# Patient Record
Sex: Female | Born: 2000 | Race: Black or African American | Hispanic: No | Marital: Single | Smoking: Never smoker
Health system: Southern US, Community
[De-identification: ages and names within clinical notes are randomized; demographics above are authoritative.]

## PROBLEM LIST (undated history)

## (undated) DIAGNOSIS — L679 Hair color and hair shaft abnormality, unspecified: Secondary | ICD-10-CM

## (undated) DIAGNOSIS — R7989 Other specified abnormal findings of blood chemistry: Secondary | ICD-10-CM

## (undated) DIAGNOSIS — N915 Oligomenorrhea, unspecified: Secondary | ICD-10-CM

## (undated) DIAGNOSIS — F419 Anxiety disorder, unspecified: Secondary | ICD-10-CM

## (undated) DIAGNOSIS — E282 Polycystic ovarian syndrome: Secondary | ICD-10-CM

## (undated) DIAGNOSIS — L309 Dermatitis, unspecified: Secondary | ICD-10-CM

## (undated) HISTORY — DX: Anxiety disorder, unspecified: F41.9

## (undated) HISTORY — DX: Hair color and hair shaft abnormality, unspecified: L67.9

## (undated) HISTORY — PX: WISDOM TOOTH EXTRACTION: SHX21

## (undated) HISTORY — DX: Oligomenorrhea, unspecified: N91.5

## (undated) HISTORY — DX: Other specified abnormal findings of blood chemistry: R79.89

---
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
## 2019-01-20 ENCOUNTER — Encounter (INDEPENDENT_AMBULATORY_CARE_PROVIDER_SITE_OTHER): Payer: Self-pay | Admitting: Pediatrics

## 2019-01-20 ENCOUNTER — Ambulatory Visit (INDEPENDENT_AMBULATORY_CARE_PROVIDER_SITE_OTHER): Payer: Medicaid Other | Admitting: Pediatrics

## 2019-01-20 VITALS — BP 118/72 | HR 88 | Ht 66.58 in | Wt 258.2 lb

## 2019-01-20 DIAGNOSIS — E669 Obesity, unspecified: Secondary | ICD-10-CM

## 2019-01-20 DIAGNOSIS — R635 Abnormal weight gain: Secondary | ICD-10-CM

## 2019-01-20 DIAGNOSIS — E288 Other ovarian dysfunction: Secondary | ICD-10-CM | POA: Diagnosis not present

## 2019-01-20 DIAGNOSIS — N915 Oligomenorrhea, unspecified: Secondary | ICD-10-CM | POA: Diagnosis not present

## 2019-01-20 DIAGNOSIS — L709 Acne, unspecified: Secondary | ICD-10-CM | POA: Diagnosis not present

## 2019-01-20 DIAGNOSIS — Z68.41 Body mass index (BMI) pediatric, greater than or equal to 95th percentile for age: Secondary | ICD-10-CM

## 2019-01-20 DIAGNOSIS — R7309 Other abnormal glucose: Secondary | ICD-10-CM

## 2019-01-20 DIAGNOSIS — E8881 Metabolic syndrome: Secondary | ICD-10-CM

## 2019-01-20 DIAGNOSIS — E88819 Insulin resistance, unspecified: Secondary | ICD-10-CM

## 2019-01-20 LAB — POCT GLYCOSYLATED HEMOGLOBIN (HGB A1C): HEMOGLOBIN A1C: 5.7 % — AB (ref 4.0–5.6)

## 2019-01-20 LAB — POCT GLUCOSE (DEVICE FOR HOME USE): POC Glucose: 101 mg/dl — AB (ref 70–99)

## 2019-01-20 NOTE — Patient Instructions (Signed)

## 2019-01-20 NOTE — Progress Notes (Signed)
Pediatric Endocrinology Consultation Follow-up Visit  Narda Ambermyah Spera Jun 15, 2001 [id number]   Chief Complaint: elevated testosterone, abnormal weight gain, obesity, oligomenorrhea  HPI: Nancy Delgado  is a 18 y.o. female presenting for follow-up of the above concerns.  she attended this visit alone.   1. Philippa had been followed in the past by Dr. Fransico MichaelBrennan for management of obesity, elevated testosterone and androgen levels, and oligomenorrhea with last visit in 08/2015.  She then returned to our clinic and started following with me in 08/2017 for obesity, elevated androgen levels, and oligomenorrhea so she was started on OCPs in 08/2017.   2. Darcus was last seen at [facility] on 09/29/18.  Since last visit, she has been well overall.  She did have an ED visit for chest pain on 12/18/2018 though EKG/CXR were normal; she was treated with motrin and discharged home.   Life has been stressful recently.  Her 19 year old sister has tried to commit suicide 3 times.  She has also had difficulty with friends recently, so is trying to make new friends in college.  Hyperandrogenism/oligomenorrhea: Continues on Junel Fe 1.5/30.     Withdrawal bleeding during week of inactive pills: Yes.  Menses lasting 5 days Spotting: No Severe cramping: No Acne: Trying to get acne cleared; only on her face currently.  She uses black charcoal soap and an over-the-counter scrub for acne Hair growth: Waxed chin 2 weeks ago, was shaving prior to this.  Very minimal hairs noted to chin.  She has noticed a few longer hairs on her upper back. Smoking: No   Diet review: Was trying limit po intake by eating only once daily, though found she was eating more.  She likes chocolate and thinks she eats this too much.   Activity: Not exercising, can go to the gym  Weight has increased 7lb since last visit.  BMI now 98.84%.   A1c is 5.7% today (was 5.9% at last visit).   Insulin resistance:  Mother, maternal aunt and  MGM have T2DM (mom treated with metformin).   ROS:  All systems reviewed with pertinent positives listed below; otherwise negative. Constitutional: Weight as above.  Sleeping well HEENT: Wears glasses Respiratory: No increased work of breathing currently GI: No constipation or diarrhea GU: periods as above Musculoskeletal: No joint deformity Neuro: Normal affect Endocrine: As above  Past Medical History:   Past Medical History:  Diagnosis Date  . Elevated testosterone level in female   . Hair abnormality    Hirsuitism  . Oligomenorrhea   . PCOS (polycystic ovarian syndrome)     Meds: Outpatient Encounter Medications as of 01/20/2019  Medication Sig  . BLISOVI FE 1.5/30 1.5-30 MG-MCG tablet TAKE 1 TABLET BY MOUTH DAILY  . ibuprofen (ADVIL,MOTRIN) 600 MG tablet Take 600 mg by mouth every 6 (six) hours as needed for headache.   No facility-administered encounter medications on file as of 01/20/2019.     Allergies: No Known Allergies  Surgical History: Past Surgical History:  Procedure Laterality Date  . WISDOM TOOTH EXTRACTION        Family History:  Family History  Problem Relation Age of Onset  . Diabetes Mother        started metformin 08/2017  . Hypertension Maternal Grandmother    Mother has T2DM treated with metformin, also being evaluated for PCOS Maternal aunt has T2DM and PCOS with poor health MGM has T2DM  Social History: [employer], [place] campus She does not smoke  Physical Exam:  Vitals:  01/20/19 1502  BP: 118/72  Pulse: 88  Weight: 258 lb 3.2 oz (117.1 kg)  Height: 5' 6.58" (1.691 m)   BP 118/72   Pulse 88   Ht 5' 6.58" (1.691 m)   Wt 258 lb 3.2 oz (117.1 kg)   LMP 12/28/2018 (Within Days)   BMI 40.96 kg/m  Body mass index: body mass index is 40.96 kg/m. Blood pressure percentiles are not available for patients who are 18 years or older.  Wt Readings from Last 3 Encounters:  01/20/19 258 lb 3.2 oz (117.1 kg) (>99 %, Z= 2.51)*   12/18/18 250 lb (113.4 kg) (>99 %, Z= 2.45)*  09/29/18 251 lb 12.8 oz (114.2 kg) (>99 %, Z= 2.46)*   * Growth percentiles are based on CDC (Girls, 2-20 Years) data.   Ht Readings from Last 3 Encounters:  01/20/19 5' 6.58" (1.691 m) (82 %, Z= 0.92)*  12/18/18 5' 6.5" (1.689 m) (81 %, Z= 0.89)*  09/29/18 5' 6.69" (1.694 m) (83 %, Z= 0.97)*   * Growth percentiles are based on CDC (Girls, 2-20 Years) data.   General: Well developed, overweight female in no acute distress.  Appears stated age Head: Normocephalic, atraumatic.   Eyes:  Pupils equal and round. EOMI.   Sclera white.  No eye drainage.   Ears/Nose/Mouth/Throat: Nares patent, no nasal drainage.  Normal dentition, mucous membranes moist.  Mild facial acne.  Short minimally coarse stubble on chin, few darker curly hairs on upper back Neck: supple, no cervical lymphadenopathy, no thyromegaly, + acanthosis nigricans on posterior neck Cardiovascular: regular rate, normal S1/S2, no murmurs Respiratory: No increased work of breathing.  Lungs clear to auscultation bilaterally.  No wheezes. Abdomen: soft, nontender, nondistended.  No significant hairs on abdomen.  Few striae laterally Extremities: warm, well perfused, cap refill < 2 sec.   Musculoskeletal: Normal muscle mass.  Normal strength Skin: warm, dry.  No rash.  Acne as above.   Neurologic: alert and oriented, normal speech, no tremor.  Tearful during interview when discussing her sister   Labs: Results for orders placed or performed in visit on 01/20/19  POCT Glucose (Device for Home Use)  Result Value Ref Range   Glucose Fasting, POC     POC Glucose 101 (A) 70 - 99 mg/dl  POCT glycosylated hemoglobin (Hb A1C)  Result Value Ref Range   Hemoglobin A1C 5.7 (A) 4.0 - 5.6 %   HbA1c POC (<> result, manual entry)     HbA1c, POC (prediabetic range)     HbA1c, POC (controlled diabetic range)     Labs drawn by Melanie CrazierMinda Kramer on 07/02/2017: Testosterone total 40 (<33) Free  testosterone 11.3 (<3.7) Bioavailable testosterone 24.8 (<7.9) SHBG 7 (15-130)  Labs obtained by Melanie CrazierMinda Kramer on 06/27/17: Total cholesterol 139 Triglycerides 58 HDL 49 LDL 77 Normal CMP (AST 15, ALT 25, BUN 12, Cr 0.71) TSH 1.43 (0.5-4.3) FT4 1.1 (0.8-1.4) 25-OH vitamin D 14 A1c 5.4%  Labs 05/29/15: HbA1c 5.4%, C-peptide 4.04 (normal 0.80-3.90); CBC with Hgb 12.8 and Hct 40.5, but MCV 74.2 and MCH 23.4; TSH 2.314, free T4 0.93, free T3 2.2; LH 11.9, FSH 6.2, testosterone 45, androstenedione 181 (normal 22 -225), DHEAS 307 (normal 37-307), estradiol 36.6, 17-OHP 45 (normal 16-283);   Labs 01/20/15: Dipstick urinalysis: normal  Labs 05/12/14: Testosterone 44 (normal < 30), SHBG 15 (normal 18-114), free testosterone 11.6 (normal 1.0-5.0); CBC with RBC 5.07, Hgb 11.9, Hct 37,1%, low MCV of 73.2, low MCH of 23.5; CMP normal; cholesterol 114, triglycerides 41,  HDL 42, LDL 64; TSH 1.774; HbA1c 5.6%  Labs 01/21/14: CBC with RBC 5.29 (normal 3.8-5.2), Hgb 12.7, Hct 39.2%, low MCV 74.1, low MCH of 24.0; CMP normal; TSH 2.174, T4 8.4; HbA1c 5.4%.   Assessment/Plan: Laconya is a 18 y.o. female with history of oligomenorrhea and clinical and biochemical hyperandrogenism who is currently doing well on combination OCPs.  Menses are improved, acne and hirsutism remain present though seem slightly improved.  She has had weight gain since last visit due to increased life stress, though A1c has improved.  She would continue to benefit from lifestyle modifications to prevent weight gain and improve insulin resistance.   1. Hyperandrogenism/ 2. Oligomenorrhea, unspecified type/ 3. Acne, unspecified acne type -Continue current OCP.  Discussed that if hair growth remains an issue, we can consider adding spironolactone at next visit.  -I have discussed her increased risk of blood clot while on estrogen with her at prior visits.  4. Insulin resistance/ 5. Elevated hemoglobin A1c/ 6. Abnormal weight gain/ 7.  Obesity due to excess calories without serious comorbidity with body mass index (BMI) in 95th to 98th percentile for age in pediatric patient -POC A1c and glucose as above -Encouraged to be physically active as much as possible -Discussed eating regular meals to avoid overeating.  Also discussed smaller portions of chocolate to limit calories -Will continue to monitor weight gain   Follow-up:   Return in about 4 months (around 05/21/2019).   Level of Service: This visit lasted in excess of 40 minutes. More than 50% of the visit was devoted to counseling.   Casimiro Needle, MD

## 2019-05-25 ENCOUNTER — Ambulatory Visit (INDEPENDENT_AMBULATORY_CARE_PROVIDER_SITE_OTHER): Payer: Medicaid Other | Admitting: Pediatrics

## 2019-05-25 ENCOUNTER — Encounter (INDEPENDENT_AMBULATORY_CARE_PROVIDER_SITE_OTHER): Payer: Self-pay | Admitting: Pediatrics

## 2019-05-25 ENCOUNTER — Other Ambulatory Visit: Payer: Self-pay

## 2019-05-25 DIAGNOSIS — E6609 Other obesity due to excess calories: Secondary | ICD-10-CM

## 2019-05-25 DIAGNOSIS — E288 Other ovarian dysfunction: Secondary | ICD-10-CM | POA: Diagnosis not present

## 2019-05-25 DIAGNOSIS — E8881 Metabolic syndrome: Secondary | ICD-10-CM

## 2019-05-25 DIAGNOSIS — N915 Oligomenorrhea, unspecified: Secondary | ICD-10-CM

## 2019-05-25 DIAGNOSIS — R7309 Other abnormal glucose: Secondary | ICD-10-CM

## 2019-05-25 DIAGNOSIS — L709 Acne, unspecified: Secondary | ICD-10-CM

## 2019-05-25 DIAGNOSIS — Z68.41 Body mass index (BMI) pediatric, greater than or equal to 95th percentile for age: Secondary | ICD-10-CM

## 2019-05-25 NOTE — Patient Instructions (Addendum)
  Feel free to contact our office during normal business hours at [phone] with questions or concerns. If you need [place] urgently after normal business hours, please call the above number to reach our answering service who will contact the on-call pediatric endocrinologist.  If you choose to communicate with [place] via [place], please do not send urgent messages as this inbox is NOT monitored on nights or weekends.  Urgent concerns should be discussed with the on-call pediatric endocrinologist.  Continue taking your birth control pills.  Please let me know if you have problems.   Try to avoid sugary drinks and get some activity daily

## 2019-05-25 NOTE — Progress Notes (Signed)
This is a Pediatric Specialist E-Visit follow up consult provided via WebEx Jodene Nam consented to an E-Visit consult today.  Location of patient: Nancy Delgado is at home Location of provider: Glenna Durand is at home office  Patient was referred by Maudry Mayhew, NP  Patient currently does not have a PCP  The following participants were involved in this E-Visit: Bethann Goo, RMA Jerelene Redden, MD [facility] patient Chief Complain/ Reason for E-Visit today: obesity follow up  Total time on call: 11 minutes Follow up: 3 months    Pediatric Endocrinology Consultation Follow-up Visit  Sitlaly Gudiel 2001-06-01 [id number]   Chief Complaint: elevated testosterone, abnormal weight gain, obesity, oligomenorrhea  HPI: Nancy Delgado  is a 18 y.o. female presenting for follow-up of the above concerns.  she attended this visit alone.  THIS IS A TELEHEALTH VIDEO VISIT.   43. Danne had been followed in the past by Dr. Tobe Sos for management of obesity, elevated testosterone and androgen levels, and oligomenorrhea with last visit in 08/2015.  She then returned to our clinic and started following with me in 08/2017 for obesity, elevated androgen levels, and oligomenorrhea so she was started on OCPs in 08/2017.   2. Maytal [facility]2019.  Since last visit, she has been well.    Hyperandrogenism/oligomenorrhea: Continues on Junel Fe 1.5/30 Withdrawal bleeding during week of inactive pills: Yes, was having periods lasting 7 days though pharmacy switched brands on her (progesterone/estradiol doses are still the same) though she has not had a period yet since starting the new pack Spotting: No Severe cramping: Has some cramps at the beginning of menses, though better by the end Acne: No recent change Hair growth: No recent change. Has had to wax/shave in the past Smoking: No  Diet review: Has not been doing well with diet recently.  Has been eating  out a lot and drinking soda.    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  Recent weight changes: Not that she is aware of Most recent A1c 5.7% in 01/2019  ROS:  All systems reviewed with pertinent positives listed below; otherwise negative. Constitutional: Weight as above.   HEENT: Wears glasses, no recent vision changes. Respiratory: No increased work of breathing currently GU: periods as above.  No nocturia or polyuria Neuro: Normal affect Endocrine: As above  Past Medical History:   Past Medical History:  Diagnosis Date  . Elevated testosterone level in female   . Hair abnormality    Hirsuitism  . Oligomenorrhea   . PCOS (polycystic ovarian syndrome)     Meds: Outpatient Encounter Medications as of 05/25/2019  Medication Sig  . BLISOVI FE 1.5/30 1.5-30 MG-MCG tablet TAKE 1 TABLET BY MOUTH DAILY  . ibuprofen (ADVIL,MOTRIN) 600 MG tablet Take 600 mg by mouth every 6 (six) hours as needed for headache.   No facility-administered encounter medications on file as of 05/25/2019.    Allergies: No Known Allergies  Surgical History: Past Surgical History:  Procedure Laterality Date  . WISDOM TOOTH EXTRACTION        Family History:  Family History  Problem Relation Age of Onset  . Diabetes Mother        started metformin 08/2017  . Hypertension Maternal Grandmother    Mother has T2DM treated with metformin, also being evaluated for PCOS Maternal  aunt has T2DM and PCOS with poor health MGM has T2DM  Social History: Attends [employer], finished the semester well She does not smoke  Physical Exam:  There were no vitals filed for this visit. LMP 05/04/2019 (Approximate)  Body mass index: body mass index is unknown because there is no height or weight on file. Blood pressure percentiles are not available for  patients who are 18 years or older.  Wt Readings from Last 3 Encounters:  01/20/19 258 lb 3.2 oz (117.1 kg) (>99 %, Z= 2.51)*  12/18/18 250 lb (113.4 kg) (>99 %, Z= 2.45)*  09/29/18 251 lb 12.8 oz (114.2 kg) (>99 %, Z= 2.46)*   * Growth percentiles are based on CDC (Girls, 2-20 Years) data.   Ht Readings from Last 3 Encounters:  01/20/19 5' 6.58" (1.691 m) (82 %, Z= 0.92)*  12/18/18 5' 6.5" (1.689 m) (81 %, Z= 0.89)*  09/29/18 5' 6.69" (1.694 m) (83 %, Z= 0.97)*   * Growth percentiles are based on CDC (Girls, 2-20 Years) data.   General: Well developed, well nourished female in no acute distress.  Appears stated age Head: Normocephalic, atraumatic.   Eyes:  Pupils equal and round. Sclera white.  No eye drainage.   Ears/Nose/Mouth/Throat: Nares patent, no nasal drainage.  Normal dentition, mucous membranes moist.   Neck: No obvious thyromegaly Cardiovascular: Well perfused, no cyanosis Respiratory: No increased work of breathing.  No cough. Skin: No rash or lesions. Minimal facial acne  Neurologic: alert and oriented, normal speech, no tremor  Labs: Results for orders placed or performed in visit on 01/20/19  POCT Glucose (Device for Home Use)  Result Value Ref Range   Glucose Fasting, POC     POC Glucose 101 (A) 70 - 99 mg/dl  POCT glycosylated hemoglobin (Hb A1C)  Result Value Ref Range   Hemoglobin A1C 5.7 (A) 4.0 - 5.6 %   HbA1c POC (<> result, manual entry)     HbA1c, POC (prediabetic range)     HbA1c, POC (controlled diabetic range)     Labs drawn by Melanie CrazierMinda Kramer on 07/02/2017: Testosterone total 40 (<33) Free testosterone 11.3 (<3.7) Bioavailable testosterone 24.8 (<7.9) SHBG 7 (15-130)  Labs obtained by Melanie CrazierMinda Kramer on 06/27/17: Total cholesterol 139 Triglycerides 58 HDL 49 LDL 77 Normal CMP (AST 15, ALT 25, BUN 12, Cr 0.71) TSH 1.43 (0.5-4.3) FT4 1.1 (0.8-1.4) 25-OH vitamin D 14 A1c 5.4%  Labs 05/29/15: HbA1c 5.4%, C-peptide 4.04 (normal 0.80-3.90); CBC  with Hgb 12.8 and Hct 40.5, but MCV 74.2 and MCH 23.4; TSH 2.314, free T4 0.93, free T3 2.2; LH 11.9, FSH 6.2, testosterone 45, androstenedione 181 (normal 22 -225), DHEAS 307 (normal 37-307), estradiol 36.6, 17-OHP 45 (normal 16-283);   Labs 01/20/15: Dipstick urinalysis: normal  Labs 05/12/14: Testosterone 44 (normal < 30), SHBG 15 (normal 18-114), free testosterone 11.6 (normal 1.0-5.0); CBC with RBC 5.07, Hgb 11.9, Hct 37,1%, low MCV of 73.2, low MCH of 23.5; CMP normal; cholesterol 114, triglycerides 41, HDL 42, LDL 64; TSH 1.774; HbA1c 5.6%  Labs 01/21/14: CBC with RBC 5.29 (normal 3.8-5.2), Hgb 12.7, Hct 39.2%, low MCV 74.1, low MCH of 24.0; CMP normal; TSH 2.174, T4 8.4; HbA1c 5.4%.   Assessment/Plan: Ronnald Collummyah is a 18 y.o. female with history of oligomenorrhea and clinical and biochemical hyperandrogenism who is currently doing fine on combination OCPs.  I cannot explain why she has not had withdrawal bleed yet since changing to different brand (same doses/progesterone); advised to continue taking this pack of OCPs.  Explained that she may have some spotting since withdrawal bleed did not occur as expected.  She also continues drinking regular soda and eating out frequently with limited physical activity.  She would benefit from lifestyle changes as she has a family history of T2DM and a personal hx of elevated A1c.  1. Hyperandrogenism/ 2. Oligomenorrhea, unspecified type/ 3. Acne, unspecified acne type -Continue current OCP.  Discussed possibility of spotting as above. Advised to contact me with questions -Advised against smoking.   -Reviewed the symptoms of blood clot (pain/swelling in 1 leg)  4. Insulin resistance/ 5. Elevated hemoglobin A1c/ 6. Obesity due to excess calories without serious comorbidity with body mass index (BMI) in 95th to 98th percentile for age in pediatric patient -Encouraged healthy eating including eliminating regular sodas/switching to diet soda -Encouraged  increased physical activity -Will repeat A1c at next visit   Follow-up:   Return in about 3 months (around 08/25/2019).   Casimiro NeedleAshley Bashioum Mikal Wisman, MD

## 2019-05-26 ENCOUNTER — Ambulatory Visit (INDEPENDENT_AMBULATORY_CARE_PROVIDER_SITE_OTHER): Payer: Medicaid Other | Admitting: Pediatrics

## 2019-08-03 ENCOUNTER — Other Ambulatory Visit: Payer: Self-pay

## 2019-08-03 ENCOUNTER — Ambulatory Visit (INDEPENDENT_AMBULATORY_CARE_PROVIDER_SITE_OTHER): Payer: Medicaid Other | Admitting: Pediatrics

## 2019-08-03 ENCOUNTER — Encounter (INDEPENDENT_AMBULATORY_CARE_PROVIDER_SITE_OTHER): Payer: Self-pay | Admitting: Pediatrics

## 2019-08-03 VITALS — BP 112/68 | HR 72 | Ht 66.81 in | Wt 260.2 lb

## 2019-08-03 DIAGNOSIS — N915 Oligomenorrhea, unspecified: Secondary | ICD-10-CM

## 2019-08-03 DIAGNOSIS — R7309 Other abnormal glucose: Secondary | ICD-10-CM | POA: Diagnosis not present

## 2019-08-03 DIAGNOSIS — L709 Acne, unspecified: Secondary | ICD-10-CM

## 2019-08-03 DIAGNOSIS — E6609 Other obesity due to excess calories: Secondary | ICD-10-CM

## 2019-08-03 DIAGNOSIS — E288 Other ovarian dysfunction: Secondary | ICD-10-CM | POA: Diagnosis not present

## 2019-08-03 DIAGNOSIS — E8881 Metabolic syndrome: Secondary | ICD-10-CM

## 2019-08-03 DIAGNOSIS — Z68.41 Body mass index (BMI) pediatric, greater than or equal to 95th percentile for age: Secondary | ICD-10-CM

## 2019-08-03 LAB — POCT GLYCOSYLATED HEMOGLOBIN (HGB A1C): Hemoglobin A1C: 5.6 % (ref 4.0–5.6)

## 2019-08-03 LAB — POCT GLUCOSE (DEVICE FOR HOME USE): Glucose Fasting, POC: 101 mg/dL — AB (ref 70–99)

## 2019-08-03 MED ORDER — NORETHIN ACE-ETH ESTRAD-FE 1.5-30 MG-MCG PO TABS: 1.0000 | ORAL_TABLET | Freq: Every day | ORAL | 8 refills | Status: DC

## 2019-08-03 NOTE — Progress Notes (Signed)
Pediatric Endocrinology Consultation Follow-up Visit  Nancy Delgado [birth year] [id number]   Chief Complaint: elevated testosterone/hyperandrogenism, insulin resistance, obesity, oligomenorrhea  HPI: Nancy Delgado is a 18 y.o. female presenting for follow-up of the above concerns.  she attended this visit alone.     1. Nancy Delgado had been followed in the past by Dr. Fransico MichaelBrennan for management of obesity, elevated testosterone and androgen levels, and oligomenorrhea with last visit in 08/2015.  She then returned to our clinic and started following with me in 08/2017 for obesity, elevated androgen levels, and oligomenorrhea so she was started on OCPs in 08/2017.   2. Since last visit on 05/25/2019 (telehealth), she has been well.  Continues on Junel Fe 1.5/30 (though pharmacy substituted a different brand).  Has a light period one month, then a heavy period the next Withdrawal bleeding during week of inactive pills: Yes.  Lasts about 5 days Spotting: No Severe cramping: Only with very heavy periods.  Acne: Unchanged Hair growth: Remains the same.  Still having to remove facial hair Smoking: No  Diet changes: Was eating worse with COVID.  Since school has started back she has been trying to eat better. Will drink sweet tea if out.  Mostly water at home as this is all mom will buy.  Activity: Has been walking more with her college friends since school is back.   Weight has increased 2lb since last visit.  BMI now 98.73%.   A1c is 5.6% today (was 5.7% at last visit).   Insulin resistance:  Mother, maternal aunt and MGM have T2DM (mom treated with metformin).  Recent weight changes: see above Most recent A1c 5.7% in 01/2019; improved to normal range today at 5%  ROS All systems reviewed with pertinent positives listed below; otherwise negative. Constitutional: Weight as above.  HEENT: wears glasses, no recent glasses changes Respiratory: No increased work of breathing currently GU: periods as  above Musculoskeletal: No joint deformity Neuro: Normal affect Endocrine: As above   Past Medical History:   Past Medical History:  Diagnosis Date  . Elevated testosterone level in female   . Hair abnormality    Hirsuitism  . Oligomenorrhea   . PCOS (polycystic ovarian syndrome)     Meds: Outpatient Encounter Medications as of 08/03/2019  Medication Sig  . ibuprofen (ADVIL,MOTRIN) 600 MG tablet Take 600 mg by mouth every 6 (six) hours as needed for headache.  . [DISCONTINUED] BLISOVI FE 1.5/30 1.5-30 MG-MCG tablet TAKE 1 TABLET BY MOUTH DAILY  . norethindrone-ethinyl estradiol-iron (LOESTRIN FE) 1.5-30 MG-MCG tablet Take 1 tablet by mouth daily.   No facility-administered encounter medications on file as of 08/03/2019.    Allergies: No Known Allergies  Surgical History: Past Surgical History:  Procedure Laterality Date  . WISDOM TOOTH EXTRACTION        Family History:  Family History  Problem Relation Age of Onset  . Diabetes Mother        started metformin 08/2017  . Hypertension Maternal Grandmother    Mother has T2DM treated with metformin, also being evaluated for PCOS Maternal aunt has T2DM and PCOS with poor health MGM has T2DM Dad recently had a stroke so cannot work  Social History: Attends [employer] Still working 2 overnights per week at [employer]  Physical Exam:  Vitals:   08/03/19 1023  BP: 112/68  Pulse: 72  Weight: 260 lb 3.2 oz (118 kg)  Height: 5' 6.81" (1.697 m)   BP 112/68   Pulse 72   Ht 5' 6.81" (  1.697 m)   Wt 260 lb 3.2 oz (118 kg)   LMP 07/20/2019 (Approximate)   BMI 40.98 kg/m  Body mass index: body mass index is 40.98 kg/m. Blood pressure percentiles are not available for patients who are 18 years or older.  Wt Readings from Last 3 Encounters:  08/03/19 260 lb 3.2 oz (118 kg) (>99 %, Z= 2.55)*  01/20/19 258 lb 3.2 oz (117.1 kg) (>99 %, Z= 2.51)*  12/18/18 250 lb (113.4 kg) (>99 %, Z= 2.45)*   * Growth percentiles are based  on CDC (Girls, 2-20 Years) data.   Ht Readings from Last 3 Encounters:  08/03/19 5' 6.81" (1.697 m) (84 %, Z= 1.00)*  01/20/19 5' 6.58" (1.691 m) (82 %, Z= 0.92)*  12/18/18 5' 6.5" (1.689 m) (81 %, Z= 0.89)*   * Growth percentiles are based on CDC (Girls, 2-20 Years) data.   General: Well developed, overweight female in no acute distress.  Appears stated age Head: Normocephalic, atraumatic.   Eyes:  Pupils equal and round. EOMI.   Sclera white.  No eye drainage.   Ears/Nose/Mouth/Throat: wearing a mask   Neck: supple, no cervical lymphadenopathy, no thyromegaly, + acanthosis nigricans Cardiovascular: regular rate, normal S1/S2, no murmurs Respiratory: No increased work of breathing.  Lungs clear to auscultation bilaterally.  No wheezes. Abdomen: soft, nontender, nondistended.  Extremities: warm, well perfused, cap refill < 2 sec.   Musculoskeletal: Normal muscle mass.  Normal strength Skin: warm, dry.  No rash.  Acanthosis on neck and flexor surfaces of arms Neurologic: alert and oriented, normal speech, no tremor  Labs: Results for orders placed or performed in visit on 08/03/19  POCT Glucose (Device for Home Use)  Result Value Ref Range   Glucose Fasting, POC 101 (A) 70 - 99 mg/dL   POC Glucose    POCT glycosylated hemoglobin (Hb A1C)  Result Value Ref Range   Hemoglobin A1C 5.6 4.0 - 5.6 %   HbA1c POC (<> result, manual entry)     HbA1c, POC (prediabetic range)     HbA1c, POC (controlled diabetic range)     Labs drawn by Melanie Crazier on 07/02/2017: Testosterone total 40 (<33) Free testosterone 11.3 (<3.7) Bioavailable testosterone 24.8 (<7.9) SHBG 7 (15-130)  Labs obtained by Melanie Crazier on 06/27/17: Total cholesterol 139 Triglycerides 58 HDL 49 LDL 77 Normal CMP (AST 15, ALT 25, BUN 12, Cr 0.71) TSH 1.43 (0.5-4.3) FT4 1.1 (0.8-1.4) 25-OH vitamin D 14 A1c 5.4%  Labs 05/29/15: HbA1c 5.4%, C-peptide 4.04 (normal 0.80-3.90); CBC with Hgb 12.8 and Hct 40.5, but MCV  74.2 and MCH 23.4; TSH 2.314, free T4 0.93, free T3 2.2; LH 11.9, FSH 6.2, testosterone 45, androstenedione 181 (normal 22 -225), DHEAS 307 (normal 37-307), estradiol 36.6, 17-OHP 45 (normal 16-283);   Labs 01/20/15: Dipstick urinalysis: normal  Labs 05/12/14: Testosterone 44 (normal < 30), SHBG 15 (normal 18-114), free testosterone 11.6 (normal 1.0-5.0); CBC with RBC 5.07, Hgb 11.9, Hct 37,1%, low MCV of 73.2, low MCH of 23.5; CMP normal; cholesterol 114, triglycerides 41, HDL 42, LDL 64; TSH 1.774; HbA1c 5.6%  Labs 01/21/14: CBC with RBC 5.29 (normal 3.8-5.2), Hgb 12.7, Hct 39.2%, low MCV 74.1, low MCH of 24.0; CMP normal; TSH 2.174, T4 8.4; HbA1c 5.4%.   Assessment/Plan: Keyshla is a 17 y.o. female with history of oligomenorrhea and clinical and biochemical hyperandrogenism who is currently doing fine on combination OCPs.  She also has a history of insulin resistance/elevated A1c to the pre-DM range in the past (  5.6% today) and obesity (BMI 98.73%).  She would continue to benefit from lifestyle changes as she has a family history of T2DM and a personal hx of elevated A1c.  1. Hyperandrogenism/ 2. Oligomenorrhea, unspecified type/ 3. Acne, unspecified acne type -Continue current OCP.  Sent refill to her pharmacy.  Advised to let me know if she is having menstrual irregularities. -Advised against smoking.    4. Insulin resistance/ 5. Elevated hemoglobin A1c/ 6. Obesity due to excess calories without serious comorbidity with body mass index (BMI) in 95th to 98th percentile for age in pediatric patient -Encouraged healthy eating including reducing sweet tea (drink half-half tea instead). Eat out less often -Encouraged increased physical activity -Explained A1c results   Follow-up:   Return in about 4 months (around 12/03/2019).   Level of Service: This visit lasted in excess of 25 minutes. More than 50% of the visit was devoted to counseling.   Levon Hedger, MD

## 2019-08-03 NOTE — Patient Instructions (Signed)
It was a pleasure to see you in clinic today.   Feel free to contact our office during normal business hours at [phone] with questions or concerns. If you need [place] urgently after normal business hours, please call the above number to reach our answering service who will contact the on-call pediatric endocrinologist.  -Be active every day (at least 30 minutes of activity is ideal) -Don't drink your calories!  Drink water, white milk, or sugar-free drinks -Watch portion sizes -Reduce frequency of eating out  Let me know if periods become irregular

## 2019-09-23 ENCOUNTER — Other Ambulatory Visit (INDEPENDENT_AMBULATORY_CARE_PROVIDER_SITE_OTHER): Payer: Self-pay | Admitting: Pediatrics

## 2019-09-23 DIAGNOSIS — L709 Acne, unspecified: Secondary | ICD-10-CM

## 2019-09-23 DIAGNOSIS — N915 Oligomenorrhea, unspecified: Secondary | ICD-10-CM

## 2019-09-23 DIAGNOSIS — E288 Other ovarian dysfunction: Secondary | ICD-10-CM

## 2019-10-06 ENCOUNTER — Encounter (INDEPENDENT_AMBULATORY_CARE_PROVIDER_SITE_OTHER): Payer: Self-pay | Admitting: Pediatrics

## 2019-12-07 ENCOUNTER — Ambulatory Visit (INDEPENDENT_AMBULATORY_CARE_PROVIDER_SITE_OTHER): Payer: Medicaid Other | Admitting: Pediatrics

## 2019-12-08 ENCOUNTER — Ambulatory Visit (INDEPENDENT_AMBULATORY_CARE_PROVIDER_SITE_OTHER): Payer: Medicaid Other | Admitting: Pediatrics

## 2019-12-08 ENCOUNTER — Other Ambulatory Visit: Payer: Self-pay

## 2019-12-08 ENCOUNTER — Encounter (INDEPENDENT_AMBULATORY_CARE_PROVIDER_SITE_OTHER): Payer: Self-pay | Admitting: Pediatrics

## 2019-12-08 VITALS — BP 126/84 | HR 80 | Ht 66.58 in | Wt 268.0 lb

## 2019-12-08 DIAGNOSIS — L2082 Flexural eczema: Secondary | ICD-10-CM

## 2019-12-08 DIAGNOSIS — E288 Other ovarian dysfunction: Secondary | ICD-10-CM

## 2019-12-08 DIAGNOSIS — Z68.41 Body mass index (BMI) pediatric, greater than or equal to 95th percentile for age: Secondary | ICD-10-CM

## 2019-12-08 DIAGNOSIS — R635 Abnormal weight gain: Secondary | ICD-10-CM

## 2019-12-08 DIAGNOSIS — L709 Acne, unspecified: Secondary | ICD-10-CM

## 2019-12-08 DIAGNOSIS — E6609 Other obesity due to excess calories: Secondary | ICD-10-CM

## 2019-12-08 DIAGNOSIS — N915 Oligomenorrhea, unspecified: Secondary | ICD-10-CM | POA: Diagnosis not present

## 2019-12-08 DIAGNOSIS — E8881 Metabolic syndrome: Secondary | ICD-10-CM | POA: Diagnosis not present

## 2019-12-08 LAB — POCT GLYCOSYLATED HEMOGLOBIN (HGB A1C): Hemoglobin A1C: 5.3 % (ref 4.0–5.6)

## 2019-12-08 LAB — POCT GLUCOSE (DEVICE FOR HOME USE): Glucose Fasting, POC: 90 mg/dL (ref 70–99)

## 2019-12-08 NOTE — Patient Instructions (Addendum)
It was a pleasure to see you in clinic today.   Feel free to contact our office during normal business hours at [phone] with questions or concerns. If you need [place] urgently after normal business hours, please call the above number to reach our answering service who will contact the on-call pediatric endocrinologist.  If you choose to communicate with [place] via [place], please do not send urgent messages as this inbox is NOT monitored on nights or weekends.  Urgent concerns should be discussed with the on-call pediatric endocrinologist.   Apply 1% hydrocortisone ointment to your eczema patches after bath.    Continue your current pill daily.

## 2019-12-08 NOTE — Progress Notes (Signed)
Pediatric Endocrinology Consultation Follow-up Visit  Nancy Delgado 12/23/2000 [id number]   Chief Complaint: elevated testosterone/hyperandrogenism, insulin resistance, obesity, oligomenorrhea  HPI: Nancy Delgado is a 18 y.o. female presenting for follow-up of the above concerns.  she attended this visit alone.    1. Nancy Delgado had been followed in the past by Dr. Fransico Michael for management of obesity, elevated testosterone and androgen levels, and oligomenorrhea with last visit in 08/2015.  She then returned to our clinic and started following with me in 08/2017 for obesity, elevated androgen levels, and oligomenorrhea so she was started on OCPs in 08/2017.   2. Since last visit on 08/03/2019, she has been well.  Continues on Junel Fe 1.5/30 Withdrawal bleeding during week of inactive pills: Yes Spotting: No Severe cramping: was hurting yesterday, having period now Will have sharp pains in her pelvic area/up her side lasting for seconds about twice per month Acne: on chin, may be due to mask Hair growth: comes on chest/breast, shaving occasionally (2-3 hairs total) Smoking: No  Diet Changes: Tried to be healthy x 1 week after last visit, not much since.  Eating less frequently, twice per day.  Past week has tried to eat healthier.  Drinks mostly water, had some sodas since it was the holidays.    Activity: Not much recently.    Weight has increased 8lb since last visit.  BMI now 98.81%.   A1c is 5.3% today (was 5.6% at last visit).   Insulin resistance:  Mother, maternal aunt and MGM have T2DM (mom treated with metformin).  Recent weight changes: see above A1c 5.7% in 01/2019; improved to normal range today at 5.3%  ROS All systems reviewed with pertinent positives listed below; otherwise negative. Constitutional: Weight as above. Sleeping ok HEENT: wears glasses, no recent vision changes Respiratory: No increased work of breathing currently GI: No constipation or diarrhea GU: periods as  above Musculoskeletal: No joint deformity Neuro: Normal affect Endocrine: As above Skin: eczema on leg, arms, abdomen.  Asking for cream as she has not been able to get in to see anyone due to COVID  Past Medical History:   Past Medical History:  Diagnosis Date  . Elevated testosterone level in female   . Hair abnormality    Hirsuitism  . Oligomenorrhea   . PCOS (polycystic ovarian syndrome)     Meds: Outpatient Encounter Medications as of 12/08/2019  Medication Sig  . ibuprofen (ADVIL,MOTRIN) 600 MG tablet Take 600 mg by mouth every 6 (six) hours as needed for headache.  Ronie Spies FE 1.5/30 1.5-30 MG-MCG tablet TAKE 1 TABLET BY MOUTH DAILY (Patient not taking: Reported on 12/08/2019)   No facility-administered encounter medications on file as of 12/08/2019.   Allergies: No Known Allergies  Surgical History: Past Surgical History:  Procedure Laterality Date  . WISDOM TOOTH EXTRACTION        Family History:  Family History  Problem Relation Age of Onset  . Diabetes Mother        started metformin 08/2017  . Hypertension Maternal Grandmother    Mother has T2DM treated with metformin, also being evaluated for PCOS Maternal aunt has T2DM and PCOS with poor health MGM has T2DM Dad recently had a stroke so cannot work  Social History: Attends [employer], will start psych classes this Spring Not working currently  Physical Exam:  Vitals:   12/08/19 1344  BP: 126/84  Pulse: 80  Weight: 268 lb (121.6 kg)  Height: 5' 6.58" (1.691 m)   BP 126/84  Pulse 80   Ht 5' 6.58" (1.691 m)   Wt 268 lb (121.6 kg)   BMI 42.51 kg/m  Body mass index: body mass index is 42.51 kg/m. Blood pressure percentiles are not available for patients who are 18 years or older.  Wt Readings from Last 3 Encounters:  12/08/19 268 lb (121.6 kg) (>99 %, Z= 2.62)*  08/03/19 260 lb 3.2 oz (118 kg) (>99 %, Z= 2.55)*  01/20/19 258 lb 3.2 oz (117.1 kg) (>99 %, Z= 2.51)*   * Growth percentiles are  based on CDC (Girls, 2-20 Years) data.   Ht Readings from Last 3 Encounters:  12/08/19 5' 6.58" (1.691 m) (82 %, Z= 0.90)*  08/03/19 5' 6.81" (1.697 m) (84 %, Z= 1.00)*  01/20/19 5' 6.58" (1.691 m) (82 %, Z= 0.92)*   * Growth percentiles are based on CDC (Girls, 2-20 Years) data.   General: Well developed, overweight female in no acute distress.  Appears stated age Head: Normocephalic, atraumatic.   Eyes:  Pupils equal and round. EOMI.   Sclera white.  No eye drainage.   Ears/Nose/Mouth/Throat: Wearing a mask.  Few papular acne lesions on chin.  No significant facial hairs.   Neck: supple, no cervical lymphadenopathy, no thyromegaly, + acanthosis nigricans on neck Cardiovascular: regular rate, normal S1/S2, no murmurs Respiratory: No increased work of breathing.  Lungs clear to auscultation bilaterally.  No wheezes. Abdomen: soft, nontender, nondistended. Extremities: warm, well perfused, cap refill < 2 sec.   Musculoskeletal: Normal muscle mass.  Normal strength Skin: warm, dry.  Thick scaly eczematous patch on R ankle, L wrist, bilateral antecubital fossa, midline abdomen Neurologic: alert and oriented, normal speech, no tremor  Labs: Results for orders placed or performed in visit on 12/08/19  POCT HgB A1C  Result Value Ref Range   Hemoglobin A1C 5.3 4.0 - 5.6 %   HbA1c POC (<> result, manual entry)     HbA1c, POC (prediabetic range)     HbA1c, POC (controlled diabetic range)    POCT Glucose (Device for Home Use)  Result Value Ref Range   Glucose Fasting, POC 90 70 - 99 mg/dL   POC Glucose     Labs drawn by Maudry Mayhew on 07/02/2017: Testosterone total 40 (<33) Free testosterone 11.3 (<3.7) Bioavailable testosterone 24.8 (<7.9) SHBG 7 (15-130)  Labs obtained by Maudry Mayhew on 06/27/17: Total cholesterol 139 Triglycerides 58 HDL 49 LDL 77 Normal CMP (AST 15, ALT 25, BUN 12, Cr 0.71) TSH 1.43 (0.5-4.3) FT4 1.1 (0.8-1.4) 25-OH vitamin D 14 A1c 5.4%  Labs 05/29/15:  HbA1c 5.4%, C-peptide 4.04 (normal 0.80-3.90); CBC with Hgb 12.8 and Hct 40.5, but MCV 74.2 and MCH 23.4; TSH 2.314, free T4 0.93, free T3 2.2; LH 11.9, FSH 6.2, testosterone 45, androstenedione 181 (normal 22 -225), DHEAS 307 (normal 37-307), estradiol 36.6, 17-OHP 45 (normal 16-283);   Labs 01/20/15: Dipstick urinalysis: normal  Labs 05/12/14: Testosterone 44 (normal < 30), SHBG 15 (normal 18-114), free testosterone 11.6 (normal 1.0-5.0); CBC with RBC 5.07, Hgb 11.9, Hct 37,1%, low MCV of 73.2, low MCH of 23.5; CMP normal; cholesterol 114, triglycerides 41, HDL 42, LDL 64; TSH 1.774; HbA1c 5.6%  Labs 01/21/14: CBC with RBC 5.29 (normal 3.8-5.2), Hgb 12.7, Hct 39.2%, low MCV 74.1, low MCH of 24.0; CMP normal; TSH 2.174, T4 8.4; HbA1c 5.4%.   Assessment/Plan: Janashia is a 18 y.o. female with history of oligomenorrhea and clinical and biochemical hyperandrogenism who is currently doing fine on combination OCPs.  She also has  a history of insulin resistance/elevated A1c to the pre-DM range in the past (improved to 5.3% today) and obesity (BMI 98.81%) with abnormal weight gain since last visit.  She would continue to benefit from lifestyle changes as she has a family history of T2DM and a personal hx of elevated A1c.  She also has eczema that will likely improve with topical steroid cream.  1. Hyperandrogenism/ 2. Oligomenorrhea, unspecified type/ 3. Acne, unspecified acne type -Continue current OCP -Advised against smoking -Encouraged to keep track of when sharp pain occurs in cycle  4. Insulin resistance/ 5. Abnormal weight gain/ 6. Obesity due to excess calories without serious comorbidity with body mass index (BMI) in 95th to 98th percentile for age in pediatric patient -Commended on improvement in A1c -Encouraged healthy eating/no sugary drinks -Encouraged physical activity  7. Eczema -Apply 1% hydrocortisone ointment to eczema patches daily after shower.   Follow-up:   Return in about 4  months (around 04/07/2020).   Level of Service: This visit lasted in excess of 25 minutes. More than 50% of the visit was devoted to counseling.  Casimiro NeedleAshley Bashioum Korra Christine, MD

## 2020-03-04 IMAGING — CR DG CHEST 2V
2 series · 2 of 2 positions shown · non-contrast
Comparison: None.

CLINICAL DATA: Chest pain with nausea

EXAM:
CHEST - 2 VIEW

[chest pa]
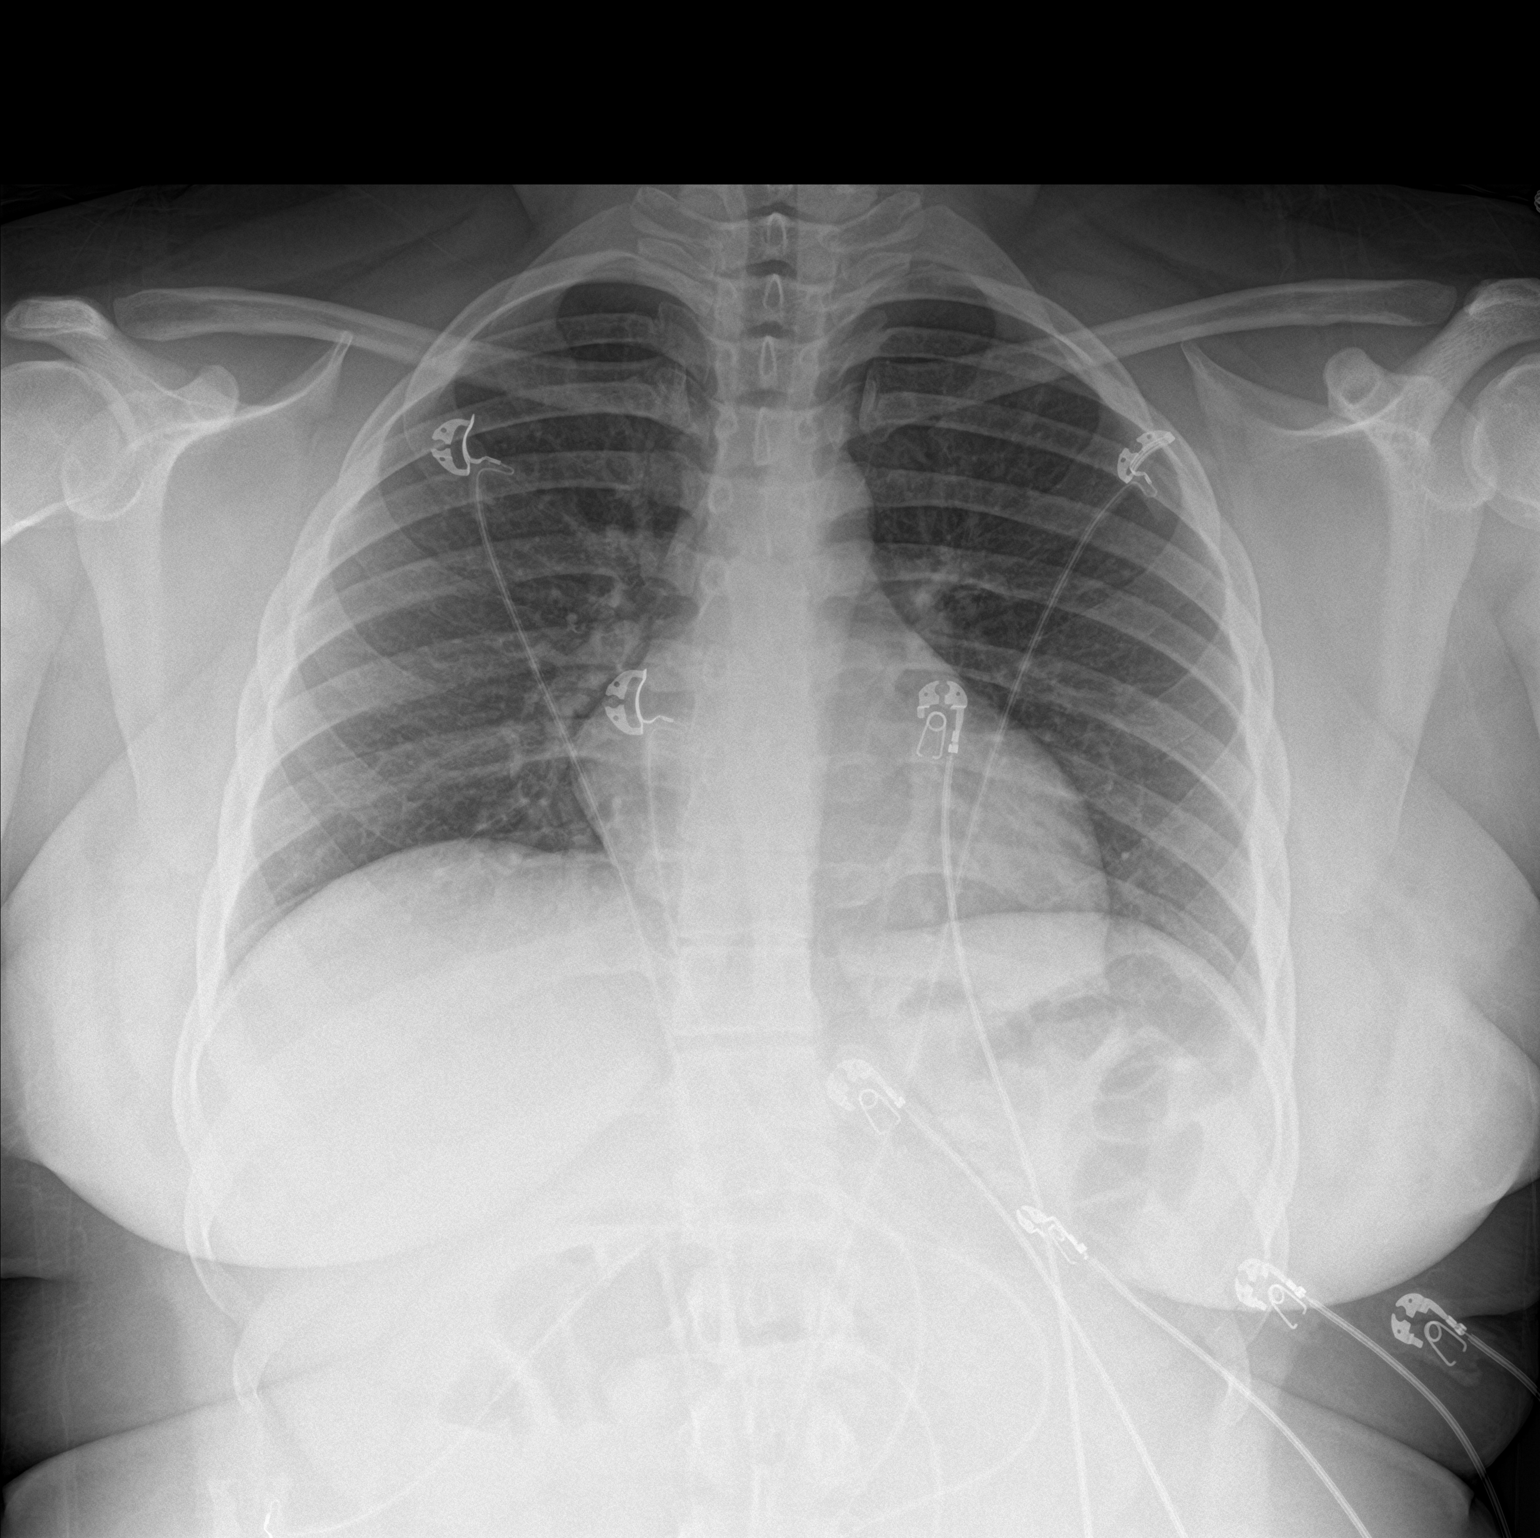

[chest lat]
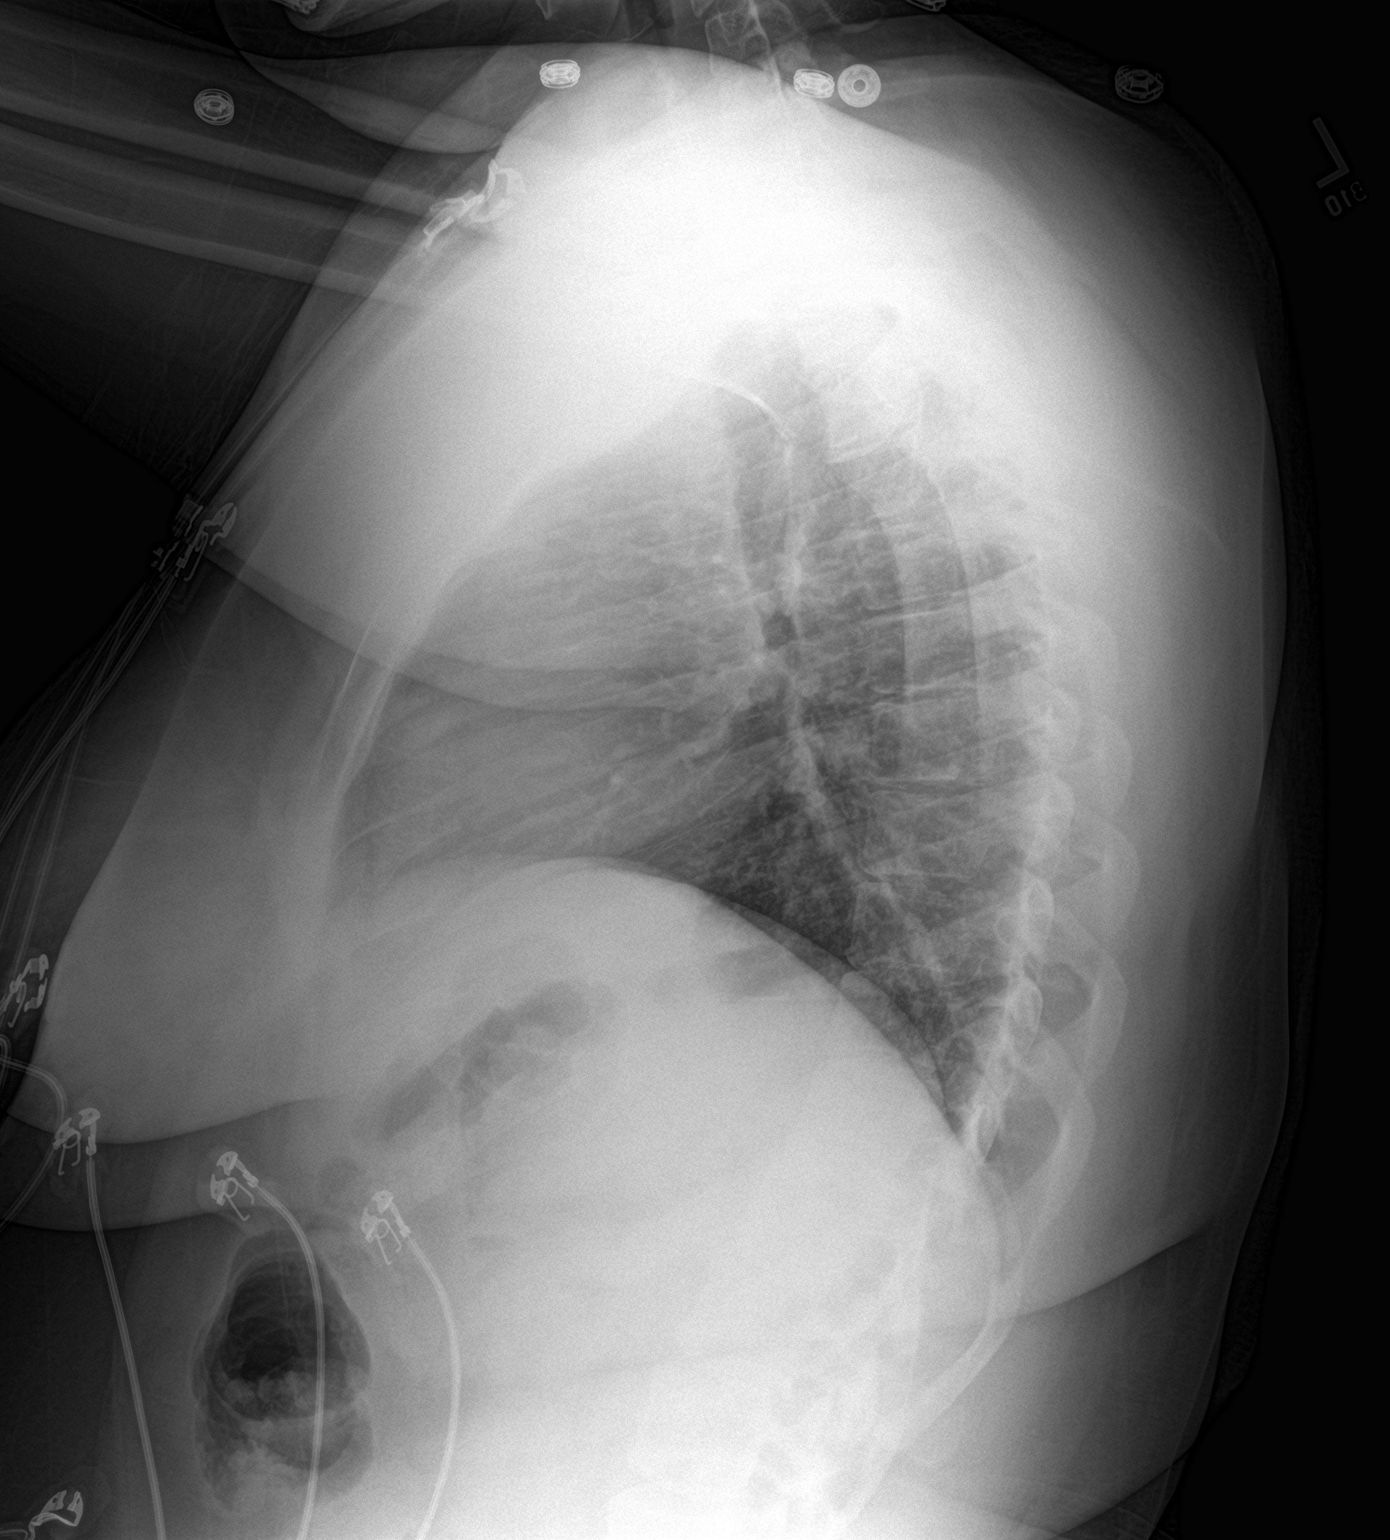

[2 of 2 positions shown; findings below may reference images not displayed]

FINDINGS: There is no edema or consolidation. The heart size and pulmonary
vascularity are normal. No adenopathy. No bone lesions. No
pneumothorax.
IMPRESSION: No edema or consolidation.

## 2020-04-12 ENCOUNTER — Encounter (INDEPENDENT_AMBULATORY_CARE_PROVIDER_SITE_OTHER): Payer: Self-pay | Admitting: Pediatrics

## 2020-04-12 ENCOUNTER — Other Ambulatory Visit: Payer: Self-pay

## 2020-04-12 ENCOUNTER — Ambulatory Visit (INDEPENDENT_AMBULATORY_CARE_PROVIDER_SITE_OTHER): Payer: Medicaid Other | Admitting: Pediatrics

## 2020-04-12 VITALS — BP 122/76 | HR 74 | Wt 274.0 lb

## 2020-04-12 DIAGNOSIS — E6609 Other obesity due to excess calories: Secondary | ICD-10-CM

## 2020-04-12 DIAGNOSIS — N915 Oligomenorrhea, unspecified: Secondary | ICD-10-CM

## 2020-04-12 DIAGNOSIS — E8881 Metabolic syndrome: Secondary | ICD-10-CM | POA: Diagnosis not present

## 2020-04-12 DIAGNOSIS — R635 Abnormal weight gain: Secondary | ICD-10-CM

## 2020-04-12 DIAGNOSIS — E288 Other ovarian dysfunction: Secondary | ICD-10-CM | POA: Diagnosis not present

## 2020-04-12 DIAGNOSIS — Z68.41 Body mass index (BMI) pediatric, greater than or equal to 95th percentile for age: Secondary | ICD-10-CM

## 2020-04-12 LAB — POCT GLYCOSYLATED HEMOGLOBIN (HGB A1C): Hemoglobin A1C: 5.7 % — AB (ref 4.0–5.6)

## 2020-04-12 LAB — POCT GLUCOSE (DEVICE FOR HOME USE): Glucose Fasting, POC: 101 mg/dL — AB (ref 70–99)

## 2020-04-12 NOTE — Progress Notes (Signed)
Pediatric Endocrinology Consultation Follow-up Visit  Nancy Delgado [birth year] [id number]   Chief Complaint: elevated testosterone/hyperandrogenism, insulin resistance, obesity, oligomenorrhea  HPI: Nancy Delgado is a 19 y.o. female presenting for follow-up of the above concerns.  she attended this visit alone.    1. Nancy Delgado had been followed in the past by Dr. Fransico Delgado for management of obesity, elevated testosterone and androgen levels, and oligomenorrhea with last visit in 08/2015.  She then returned to our clinic and started following with me in 08/2017 for obesity, elevated androgen levels, and oligomenorrhea so she was started on OCPs in 08/2017.   2. Since last visit on 12/08/2019, she has been well.  Started seeing a therapist, has been going well. Has started to make her see that she turns to food for comfort and when depressed.  Continues on Junel Fe 1.5/30 Withdrawal bleeding during week of inactive pills: yes Spotting: None Acne: "Leveling out" Hair growth: normal, waxing entire face once every 3-4 weeks.  Smoking: No  Diet Changes: -Doesn't eat as much lately (has cut down on second helpings), drinking more water.  Soda only if going out to eat (once or twice per week)  Activity: goes to gym with mom once per week to walk on the treadmill  School going well, ready to be done for the summer.  No summer classes.  Has a new job at [employer] has increased 6lb since last visit.  BMI now 98.81%.    A1c is 5.7% today (was 5.3% at last visit).   Insulin resistance:  Mother, maternal aunt and MGM have T2DM (mom treated with metformin).  Recent weight changes: see above  ROS All systems reviewed with pertinent positives listed below; otherwise negative. Sleeping well  Past Medical History:   Past Medical History:  Diagnosis Date  . Elevated testosterone level in female   . Hair abnormality    Hirsuitism  . Oligomenorrhea   . PCOS (polycystic ovarian syndrome)      Meds: Outpatient Encounter Medications as of 04/12/2020  Medication Sig  . AUROVELA FE 1.5/30 1.5-30 MG-MCG tablet TAKE 1 TABLET BY MOUTH DAILY  . ibuprofen (ADVIL,MOTRIN) 600 MG tablet Take 600 mg by mouth every 6 (six) hours as needed for headache.   No facility-administered encounter medications on file as of 04/12/2020.   Allergies: No Known Allergies  Surgical History: Past Surgical History:  Procedure Laterality Date  . WISDOM TOOTH EXTRACTION        Family History:  Family History  Problem Relation Age of Onset  . Diabetes Mother        started metformin 08/2017  . Hypertension Maternal Grandmother    Mother has T2DM treated with metformin, also being evaluated for PCOS Maternal aunt has T2DM and PCOS with poor health MGM has T2DM Dad recently had a stroke; basically doing well but cannot go back to work as a Investment banker, operational and operate the machines  Social History: Attends [employer], ready for summer break.  Plans for grad school Working at [employer]  Physical Exam:  Vitals:   04/12/20 0923  BP: 122/76  Pulse: 74  Weight: 274 lb (124.3 kg)   BP 122/76   Pulse 74   Wt 274 lb (124.3 kg)   LMP 04/05/2020   BMI 43.46 kg/m  Body mass index: body mass index is 43.46 kg/m. Blood pressure percentiles are not available for patients who are 18 years or older.  Wt Readings from Last 3 Encounters:  04/12/20 274 lb (124.3 kg) (>99 %,  Z= 2.68)*  12/08/19 268 lb (121.6 kg) (>99 %, Z= 2.62)*  08/03/19 260 lb 3.2 oz (118 kg) (>99 %, Z= 2.55)*   * Growth percentiles are based on CDC (Girls, 2-20 Years) data.   Ht Readings from Last 3 Encounters:  12/08/19 5' 6.58" (1.691 m) (82 %, Z= 0.90)*  08/03/19 5' 6.81" (1.697 m) (84 %, Z= 1.00)*  01/20/19 5' 6.58" (1.691 m) (82 %, Z= 0.92)*   * Growth percentiles are based on CDC (Girls, 2-20 Years) data.   General: Well developed, well nourished female in no acute distress.  Appears stated age Head: Normocephalic, atraumatic.   Eyes:   Pupils equal and round. EOMI.   Sclera white.  No eye drainage.   Ears/Nose/Mouth/Throat: Masked Neck: supple, no cervical lymphadenopathy, no thyromegaly, + acanthosis nigricans on neck Cardiovascular: regular rate, normal S1/S2, no murmurs Respiratory: No increased work of breathing.  Lungs clear to auscultation bilaterally.  No wheezes. Abdomen: soft, nontender, nondistended.  Extremities: warm, well perfused, cap refill < 2 sec.   Musculoskeletal: Normal muscle mass.  Normal strength Skin: warm, dry.  No rash or lesions. Neurologic: alert and oriented, normal speech, no tremor  Labs: Results for orders placed or performed in visit on 04/12/20  POCT Glucose (Device for Home Use)  Result Value Ref Range   Glucose Fasting, POC 101 (A) 70 - 99 mg/dL   POC Glucose    POCT glycosylated hemoglobin (Hb A1C)  Result Value Ref Range   Hemoglobin A1C 5.7 (A) 4.0 - 5.6 %   HbA1c POC (<> result, manual entry)     HbA1c, POC (prediabetic range)     HbA1c, POC (controlled diabetic range)     Labs drawn by Nancy Delgado on 07/02/2017: Testosterone total 40 (<33) Free testosterone 11.3 (<3.7) Bioavailable testosterone 24.8 (<7.9) SHBG 7 (15-130)  Labs obtained by Nancy Delgado on 06/27/17: Total cholesterol 139 Triglycerides 58 HDL 49 LDL 77 Normal CMP (AST 15, ALT 25, BUN 12, Cr 0.71) TSH 1.43 (0.5-4.3) FT4 1.1 (0.8-1.4) 25-OH vitamin D 14 A1c 5.4%  Labs 05/29/15: HbA1c 5.4%, C-peptide 4.04 (normal 0.80-3.90); CBC with Hgb 12.8 and Hct 40.5, but MCV 74.2 and MCH 23.4; TSH 2.314, free T4 0.93, free T3 2.2; LH 11.9, FSH 6.2, testosterone 45, androstenedione 181 (normal 22 -225), DHEAS 307 (normal 37-307), estradiol 36.6, 17-OHP 45 (normal 16-283);   Labs 01/20/15: Dipstick urinalysis: normal  Labs 05/12/14: Testosterone 44 (normal < 30), SHBG 15 (normal 18-114), free testosterone 11.6 (normal 1.0-5.0); CBC with RBC 5.07, Hgb 11.9, Hct 37,1%, low MCV of 73.2, low MCH of 23.5; CMP normal;  cholesterol 114, triglycerides 41, HDL 42, LDL 64; TSH 1.774; HbA1c 5.6%  Labs 01/21/14: CBC with RBC 5.29 (normal 3.8-5.2), Hgb 12.7, Hct 39.2%, low MCV 74.1, low MCH of 24.0; CMP normal; TSH 2.174, T4 8.4; HbA1c 5.4%.   Assessment/Plan: Amanee is a 19 y.o. female with history of oligomenorrhea and clinical and biochemical hyperandrogenism who is currently doing well on combination OCPs.  She also has a history of insulin resistance/elevated A1c to the pre-DM range and obesity with abnormal weight gain since last visit.  She would continue to benefit from lifestyle changes as she has a family history of T2DM.   1. Hyperandrogenism/ 2. Oligomenorrhea, unspecified type/ -Continue current OCP -Acne is improving  3. Insulin resistance/ 4. Abnormal weight gain/ 5. Obesity due to excess calories without serious comorbidity with body mass index (BMI) in 95th to 98th percentile for age in pediatric patient -  POC A1c and glucose as above -Commended on diet changes -Increase exercise -Change to diet soda -Discussed COVID vaccine  Follow-up:   Return in about 4 months (around 08/13/2020).   >40 minutes spent today reviewing the medical chart, counseling the patient/family, and documenting today's encounter.  Levon Hedger, MD

## 2020-04-12 NOTE — Patient Instructions (Signed)

## 2020-08-16 ENCOUNTER — Encounter (INDEPENDENT_AMBULATORY_CARE_PROVIDER_SITE_OTHER): Payer: Self-pay | Admitting: Pediatrics

## 2020-08-16 ENCOUNTER — Other Ambulatory Visit: Payer: Self-pay

## 2020-08-16 ENCOUNTER — Ambulatory Visit (INDEPENDENT_AMBULATORY_CARE_PROVIDER_SITE_OTHER): Payer: Medicaid Other | Admitting: Pediatrics

## 2020-08-16 VITALS — BP 120/72 | HR 84 | Wt 278.0 lb

## 2020-08-16 DIAGNOSIS — E288 Other ovarian dysfunction: Secondary | ICD-10-CM | POA: Diagnosis not present

## 2020-08-16 DIAGNOSIS — E6609 Other obesity due to excess calories: Secondary | ICD-10-CM

## 2020-08-16 DIAGNOSIS — N915 Oligomenorrhea, unspecified: Secondary | ICD-10-CM

## 2020-08-16 DIAGNOSIS — E8881 Metabolic syndrome: Secondary | ICD-10-CM

## 2020-08-16 DIAGNOSIS — L709 Acne, unspecified: Secondary | ICD-10-CM

## 2020-08-16 DIAGNOSIS — Z68.41 Body mass index (BMI) pediatric, greater than or equal to 95th percentile for age: Secondary | ICD-10-CM

## 2020-08-16 LAB — POCT GLUCOSE (DEVICE FOR HOME USE): POC Glucose: 114 mg/dl — AB (ref 70–99)

## 2020-08-16 LAB — POCT GLYCOSYLATED HEMOGLOBIN (HGB A1C): Hemoglobin A1C: 5.7 % — AB (ref 4.0–5.6)

## 2020-08-16 MED ORDER — NORETHIN ACE-ETH ESTRAD-FE 1.5-30 MG-MCG PO TABS: 1.0000 | ORAL_TABLET | Freq: Every day | ORAL | 6 refills | Status: DC

## 2020-08-16 NOTE — Patient Instructions (Signed)

## 2020-08-16 NOTE — Progress Notes (Signed)
Pediatric Endocrinology Consultation Follow-up Visit  Nancy Delgado 05-19-2001 [id number]  Chief Complaint: elevated testosterone/hyperandrogenism, insulin resistance, obesity, oligomenorrhea  HPI: Nancy Delgado is a 19 y.o. female presenting for follow-up of the above concerns.  she attended this visit alone.    1. Nancy Delgado had been followed in the past by Dr. Fransico Michael for management of obesity, elevated testosterone and androgen levels, and oligomenorrhea with last visit in 08/2015.  She then returned to our clinic and started following with me in 08/2017 for obesity, elevated androgen levels, and oligomenorrhea so she was started on OCPs in 08/2017.   2. Since last visit on 04/12/2020, she has been well.  Continues on Junel Fe 1.5/30 Withdrawal bleeding during week of inactive pills: yes Spotting: None Acne: not really Hair growth: whole face waxed, every 2-3 weeks Smoking: No  Diet Changes: -Not good, just went back to school so eating more.  Drinking water sometimes when eating out, will have a soda every few days  Activity: walking on campus but not much else  Lots of homework, 6 classes  Weight has increased 4lb since last visit.  A1c is 5.7% today (was 5.7% at last visit).   Insulin resistance:  Mother, maternal aunt and MGM have T2DM (mom treated with metformin).  Sister treated with metformin  ROS All systems reviewed with pertinent positives listed below; otherwise negative.  Past Medical History:   Past Medical History:  Diagnosis Date  . Elevated testosterone level in female   . Hair abnormality    Hirsuitism  . Oligomenorrhea   . PCOS (polycystic ovarian syndrome)     Meds: Outpatient Encounter Medications as of 08/16/2020  Medication Sig  . cetirizine (ZYRTEC) 10 MG tablet Take 10 mg by mouth at bedtime.  . D3-50 1.25 MG (50000 UT) capsule Take 50,000 Units by mouth once a week.  Marland Kitchen DIFFERIN 0.3 % gel Apply topically 2 (two) times daily.  . EUCRISA 2 % OINT  SMARTSIG:Sparingly Topical Twice Daily  . ibuprofen (ADVIL,MOTRIN) 600 MG tablet Take 600 mg by mouth every 6 (six) hours as needed for headache.  . norethindrone-ethinyl estradiol-iron (AUROVELA FE 1.5/30) 1.5-30 MG-MCG tablet Take 1 tablet by mouth daily.  . [DISCONTINUED] AUROVELA FE 1.5/30 1.5-30 MG-MCG tablet TAKE 1 TABLET BY MOUTH DAILY   No facility-administered encounter medications on file as of 08/16/2020.   Allergies: No Known Allergies  Surgical History: Past Surgical History:  Procedure Laterality Date  . WISDOM TOOTH EXTRACTION        Family History:  Family History  Problem Relation Age of Onset  . Diabetes Mother        started metformin 08/2017  . Hypertension Maternal Grandmother    Mother has T2DM treated with metformin, also being evaluated for PCOS Maternal aunt has T2DM and PCOS with poor health MGM has T2DM Dad recently had a stroke; basically doing well but cannot go back to work as a Investment banker, operational and operate the machines  Social History: Attends [employer], living on campus  Physical Exam:  Vitals:   08/16/20 1612  BP: 120/72  Pulse: 84  Weight: 278 lb (126.1 kg)   BP 120/72   Pulse 84   Wt 278 lb (126.1 kg)   LMP 07/25/2020   BMI 44.10 kg/m  Body mass index: body mass index is 44.1 kg/m. Blood pressure percentiles are not available for patients who are 18 years or older.  Wt Readings from Last 3 Encounters:  08/16/20 278 lb (126.1 kg) (>99 %, Z= 2.73)*  04/12/20 274 lb (124.3 kg) (>99 %, Z= 2.68)*  12/08/19 268 lb (121.6 kg) (>99 %, Z= 2.62)*   * Growth percentiles are based on CDC (Girls, 2-20 Years) data.   Ht Readings from Last 3 Encounters:  12/08/19 5' 6.58" (1.691 m) (82 %, Z= 0.90)*  08/03/19 5' 6.81" (1.697 m) (84 %, Z= 1.00)*  01/20/19 5' 6.58" (1.691 m) (82 %, Z= 0.92)*   * Growth percentiles are based on CDC (Girls, 2-20 Years) data.   General: Well developed, overweight female in no acute distress.  Appears stated age Head:  Normocephalic, atraumatic.   Eyes:  Pupils equal and round. EOMI.   Sclera white.  No eye drainage.   Ears/Nose/Mouth/Throat: Masked Neck: supple, no cervical lymphadenopathy, no thyromegaly, + acanthosis nigricans Cardiovascular: regular rate, normal S1/S2, no murmurs Respiratory: No increased work of breathing.  Lungs clear to auscultation bilaterally.  No wheezes. Abdomen: soft, nontender, nondistended.  Extremities: warm, well perfused, cap refill < 2 sec.   Musculoskeletal: Normal muscle mass.  Normal strength Skin: warm, dry.  No rash.  + acanthosis in skin folds of arms Neurologic: alert and oriented, normal speech, no tremor   Labs: Results for orders placed or performed in visit on 08/16/20  POCT Glucose (Device for Home Use)  Result Value Ref Range   Glucose Fasting, POC     POC Glucose 114 (A) 70 - 99 mg/dl  POCT glycosylated hemoglobin (Hb A1C)  Result Value Ref Range   Hemoglobin A1C 5.7 (A) 4.0 - 5.6 %   HbA1c POC (<> result, manual entry)     HbA1c, POC (prediabetic range)     HbA1c, POC (controlled diabetic range)     Labs drawn by Melanie Crazier on 07/02/2017: Testosterone total 40 (<33) Free testosterone 11.3 (<3.7) Bioavailable testosterone 24.8 (<7.9) SHBG 7 (15-130)  Labs obtained by Melanie Crazier on 06/27/17: Total cholesterol 139 Triglycerides 58 HDL 49 LDL 77 Normal CMP (AST 15, ALT 25, BUN 12, Cr 0.71) TSH 1.43 (0.5-4.3) FT4 1.1 (0.8-1.4) 25-OH vitamin D 14 A1c 5.4%  Labs 05/29/15: HbA1c 5.4%, C-peptide 4.04 (normal 0.80-3.90); CBC with Hgb 12.8 and Hct 40.5, but MCV 74.2 and MCH 23.4; TSH 2.314, free T4 0.93, free T3 2.2; LH 11.9, FSH 6.2, testosterone 45, androstenedione 181 (normal 22 -225), DHEAS 307 (normal 37-307), estradiol 36.6, 17-OHP 45 (normal 16-283);   Labs 01/20/15: Dipstick urinalysis: normal  Labs 05/12/14: Testosterone 44 (normal < 30), SHBG 15 (normal 18-114), free testosterone 11.6 (normal 1.0-5.0); CBC with RBC 5.07, Hgb 11.9, Hct  37,1%, low MCV of 73.2, low MCH of 23.5; CMP normal; cholesterol 114, triglycerides 41, HDL 42, LDL 64; TSH 1.774; HbA1c 5.6%  Labs 01/21/14: CBC with RBC 5.29 (normal 3.8-5.2), Hgb 12.7, Hct 39.2%, low MCV 74.1, low MCH of 24.0; CMP normal; TSH 2.174, T4 8.4; HbA1c 5.4%.   Assessment/Plan: Loney is a 19 y.o. female with history of oligomenorrhea and clinical and biochemical hyperandrogenism who is currently doing well on combination OCPs.  She also has a history of insulin resistance/elevated A1c to the pre-DM range and obesity..  She would continue to benefit from lifestyle changes as she has a family history of T2DM.   1. Hyperandrogenism/ 2. Oligomenorrhea, unspecified type/ 3. Acne -Continue current OCP.  New Rx sent  4. Insulin resistance/ 5.  Obesity due to excess calories without serious comorbidity with body mass index (BMI) in 95th to 98th percentile for age in pediatric patient -POC A1c and glucose as above -Discussed possibility  of metformin if A1c climbs; will continue lifestyle changes for now  Follow-up:   Return in about 4 months (around 12/16/2020).   >30 minutes spent today reviewing the medical chart, counseling the patient/family, and documenting today's encounter.  Casimiro Needle, MD

## 2020-11-28 ENCOUNTER — Ambulatory Visit (INDEPENDENT_AMBULATORY_CARE_PROVIDER_SITE_OTHER): Payer: Medicaid Other | Admitting: Pediatrics

## 2020-11-28 ENCOUNTER — Encounter (INDEPENDENT_AMBULATORY_CARE_PROVIDER_SITE_OTHER): Payer: Self-pay | Admitting: Pediatrics

## 2020-11-28 ENCOUNTER — Other Ambulatory Visit: Payer: Self-pay

## 2020-11-28 VITALS — BP 139/66 | HR 68 | Wt 286.2 lb

## 2020-11-28 DIAGNOSIS — L709 Acne, unspecified: Secondary | ICD-10-CM

## 2020-11-28 DIAGNOSIS — R030 Elevated blood-pressure reading, without diagnosis of hypertension: Secondary | ICD-10-CM

## 2020-11-28 DIAGNOSIS — Z68.41 Body mass index (BMI) pediatric, greater than or equal to 95th percentile for age: Secondary | ICD-10-CM | POA: Diagnosis not present

## 2020-11-28 DIAGNOSIS — E669 Obesity, unspecified: Secondary | ICD-10-CM | POA: Diagnosis not present

## 2020-11-28 DIAGNOSIS — E288 Other ovarian dysfunction: Secondary | ICD-10-CM | POA: Diagnosis not present

## 2020-11-28 DIAGNOSIS — N915 Oligomenorrhea, unspecified: Secondary | ICD-10-CM

## 2020-11-28 DIAGNOSIS — E8881 Metabolic syndrome: Secondary | ICD-10-CM

## 2020-11-28 LAB — POCT GLUCOSE (DEVICE FOR HOME USE): Glucose Fasting, POC: 100 mg/dL — AB (ref 70–99)

## 2020-11-28 LAB — POCT GLYCOSYLATED HEMOGLOBIN (HGB A1C): Hemoglobin A1C: 5.7 % — AB (ref 4.0–5.6)

## 2020-11-28 MED ORDER — NORETHIN ACE-ETH ESTRAD-FE 1.5-30 MG-MCG PO TABS: 1.0000 | ORAL_TABLET | Freq: Every day | ORAL | 6 refills | Status: DC

## 2020-11-28 NOTE — Patient Instructions (Signed)

## 2020-11-28 NOTE — Progress Notes (Signed)
Pediatric Endocrinology Consultation Follow-up Visit  [name] 02-22-2001 [id number]  Chief Complaint: elevated testosterone/hyperandrogenism, insulin resistance, obesity, oligomenorrhea  HPI: Nancy Delgado is a 19 y.o. female presenting for follow-up of the above concerns.  she attended this visit alone.    1. Wing had been followed in the past by Dr. Fransico Michael for management of obesity, elevated testosterone and androgen levels, and oligomenorrhea with last visit in 08/2015.  She then returned to our clinic and started following with me in 08/2017 for obesity, elevated androgen levels, and oligomenorrhea so she was started on OCPs in 08/2017.   2. Since last visit on 08/16/2020, she has been well.  Stressed with life.  Her mother is in the hospital this morning. Unsure what is going on with her.  Continues on Junel Fe 1.5/30  Withdrawal bleeding during week of inactive pills: yes. Starts bleeding a little later in the inactive pill week (bleeding 3-4 days after starting inactive pills). Bleeding x 3-4 days total. Cramping harder for past 3 months.   Spotting: None Acne: few pimples around mouth  Hair growth: continues to be the same.  Getting face waxed tomorrow.  Had burn on face after last waxing Smoking: No  Diet Changes: -Not good recently. Drinking zero sugar sodas if she does drink soda.   Activity: Nothing currently.    Weight has increased 8lb since last visit.  A1c is 5.7% today (was 5.7% at last visit).   Insulin resistance:  Mother, maternal aunt and MGM have T2DM (mom treated with metformin).  Sister treated with metformin  ROS All systems reviewed with pertinent positives listed below; otherwise negative. Constitutional: Weight has increased 8lb since last visit.   Psych: Has been getting therapy, though will see a psychiatrist tomorrow for possible medication for depression.   Past Medical History:   Past Medical History:  Diagnosis Date  . Elevated testosterone  level in female   . Hair abnormality    Hirsuitism  . Oligomenorrhea   . PCOS (polycystic ovarian syndrome)     Meds: Outpatient Encounter Medications as of 11/28/2020  Medication Sig  . ibuprofen (ADVIL,MOTRIN) 600 MG tablet Take 600 mg by mouth every 6 (six) hours as needed for headache.  . [DISCONTINUED] norethindrone-ethinyl estradiol-iron (AUROVELA FE 1.5/30) 1.5-30 MG-MCG tablet Take 1 tablet by mouth daily.  . cetirizine (ZYRTEC) 10 MG tablet Take 10 mg by mouth at bedtime. (Patient not taking: Reported on 11/28/2020)  . D3-50 1.25 MG (50000 UT) capsule Take 50,000 Units by mouth once a week. (Patient not taking: Reported on 11/28/2020)  . DIFFERIN 0.3 % gel Apply topically 2 (two) times daily. (Patient not taking: Reported on 11/28/2020)  . EUCRISA 2 % OINT SMARTSIG:Sparingly Topical Twice Daily (Patient not taking: Reported on 11/28/2020)  . norethindrone-ethinyl estradiol-iron (AUROVELA FE 1.5/30) 1.5-30 MG-MCG tablet Take 1 tablet by mouth daily.   No facility-administered encounter medications on file as of 11/28/2020.   Allergies: No Known Allergies  Surgical History: Past Surgical History:  Procedure Laterality Date  . WISDOM TOOTH EXTRACTION        Family History:  Family History  Problem Relation Age of Onset  . Diabetes Mother        started metformin 08/2017  . Hypertension Maternal Grandmother    Mother has T2DM treated with metformin, also being evaluated for PCOS Maternal aunt has T2DM and PCOS with poor health MGM has T2DM Dad recently had a stroke; basically doing well but cannot go back to work as a  chef and operate the machines  Social History: Attends [employer], living on campus Got a new puppy  Physical Exam:  Vitals:   11/28/20 0858  BP: 140/77  Pulse: 87  Weight: 286 lb 3.2 oz (129.8 kg)   BP 140/77   Pulse 87   Wt 286 lb 3.2 oz (129.8 kg)   LMP 11/21/2020   BMI 45.40 kg/m  Body mass index: body mass index is 45.4 kg/m. Growth  percentile SmartLinks can only be used for patients less than 44 years old.  Wt Readings from Last 3 Encounters:  11/28/20 286 lb 3.2 oz (129.8 kg)  08/16/20 278 lb (126.1 kg) (>99 %, Z= 2.73)*  04/12/20 274 lb (124.3 kg) (>99 %, Z= 2.68)*   * Growth percentiles are based on CDC (Girls, 2-20 Years) data.   Ht Readings from Last 3 Encounters:  12/08/19 5' 6.58" (1.691 m) (82 %, Z= 0.90)*  08/03/19 5' 6.81" (1.697 m) (84 %, Z= 1.00)*  01/20/19 5' 6.58" (1.691 m) (82 %, Z= 0.92)*   * Growth percentiles are based on CDC (Girls, 2-20 Years) data.   General: Well developed, well nourished female in no acute distress.  Appears stated age Head: Normocephalic, atraumatic.   Eyes:  Pupils equal and round. EOMI.   Sclera white.  No eye drainage.   Ears/Nose/Mouth/Throat: Masked Neck: supple, no cervical lymphadenopathy, no thyromegaly, + acanthosis nigricans on posterior neck Cardiovascular: regular rate, normal S1/S2, no murmurs Respiratory: No increased work of breathing.  Lungs clear to auscultation bilaterally.  No wheezes. Abdomen: soft, nontender, nondistended.  Extremities: warm, well perfused, cap refill < 2 sec.   Musculoskeletal: Normal muscle mass.  Normal strength Skin: warm, dry.  No rash.  Mild acne on face.  Dark short stubble present on cheeks/chin Neurologic: alert and oriented, normal speech, no tremor   Labs: Results for orders placed or performed in visit on 11/28/20  POCT Glucose (Device for Home Use)  Result Value Ref Range   Glucose Fasting, POC 100 (A) 70 - 99 mg/dL   POC Glucose    POCT glycosylated hemoglobin (Hb A1C)  Result Value Ref Range   Hemoglobin A1C 5.7 (A) 4.0 - 5.6 %   HbA1c POC (<> result, manual entry)     HbA1c, POC (prediabetic range)     HbA1c, POC (controlled diabetic range)     Labs drawn by Melanie Crazier on 07/02/2017: Testosterone total 40 (<33) Free testosterone 11.3 (<3.7) Bioavailable testosterone 24.8 (<7.9) SHBG 7 (15-130)  Labs  obtained by Melanie Crazier on 06/27/17: Total cholesterol 139 Triglycerides 58 HDL 49 LDL 77 Normal CMP (AST 15, ALT 25, BUN 12, Cr 0.71) TSH 1.43 (0.5-4.3) FT4 1.1 (0.8-1.4) 25-OH vitamin D 14 A1c 5.4%  Labs 05/29/15: HbA1c 5.4%, C-peptide 4.04 (normal 0.80-3.90); CBC with Hgb 12.8 and Hct 40.5, but MCV 74.2 and MCH 23.4; TSH 2.314, free T4 0.93, free T3 2.2; LH 11.9, FSH 6.2, testosterone 45, androstenedione 181 (normal 22 -225), DHEAS 307 (normal 37-307), estradiol 36.6, 17-OHP 45 (normal 16-283);   Labs 01/20/15: Dipstick urinalysis: normal  Labs 05/12/14: Testosterone 44 (normal < 30), SHBG 15 (normal 18-114), free testosterone 11.6 (normal 1.0-5.0); CBC with RBC 5.07, Hgb 11.9, Hct 37,1%, low MCV of 73.2, low MCH of 23.5; CMP normal; cholesterol 114, triglycerides 41, HDL 42, LDL 64; TSH 1.774; HbA1c 5.6%  Labs 01/21/14: CBC with RBC 5.29 (normal 3.8-5.2), Hgb 12.7, Hct 39.2%, low MCV 74.1, low MCH of 24.0; CMP normal; TSH 2.174, T4 8.4; HbA1c  5.4%.   Assessment/Plan: Nesiah is a 19 y.o. female with history of oligomenorrhea and clinical and biochemical hyperandrogenism who is currently doing well on combination OCPs.  She also has a history of insulin resistance/elevated A1c to the pre-DM range and obesity.  She has had weight gain since last visit and has not been keeping up with lifestyle changes. She would continue to benefit from diet changes and increased physical activity as she has a family history of T2DM and A1c in pre-DM range.   1. Hyperandrogenism/ 2. Oligomenorrhea, unspecified type/ 3. Acne -Continue current OCP. New Rx sent.  4. Insulin resistance/ 5.  Obesity due to excess calories without serious comorbidity with body mass index (BMI) in 95th to 98th percentile for age in pediatric patient -POC A1c and glucose as above -Encouraged increased physical activity and diet changes as much as possible.   6. Elevated BP reading without a diagnosis of hypertension -Repeat  BP in clinic 139/66.  Lifestyle changes will help improve this.  Will continue to monitor at future visits  Follow-up:   Return in about 4 months (around 03/29/2021).   >30 minutes spent today reviewing the medical chart, counseling the patient/family, and documenting today's encounter.   Casimiro Needle, MD

## 2021-04-03 ENCOUNTER — Ambulatory Visit (INDEPENDENT_AMBULATORY_CARE_PROVIDER_SITE_OTHER): Payer: Medicaid Other | Admitting: Pediatrics

## 2021-04-03 ENCOUNTER — Encounter (INDEPENDENT_AMBULATORY_CARE_PROVIDER_SITE_OTHER): Payer: Self-pay | Admitting: Pediatrics

## 2021-04-03 ENCOUNTER — Other Ambulatory Visit: Payer: Self-pay

## 2021-04-03 VITALS — BP 114/66 | HR 70 | Wt 279.8 lb

## 2021-04-03 DIAGNOSIS — E288 Other ovarian dysfunction: Secondary | ICD-10-CM | POA: Diagnosis not present

## 2021-04-03 DIAGNOSIS — E6609 Other obesity due to excess calories: Secondary | ICD-10-CM

## 2021-04-03 DIAGNOSIS — E8881 Metabolic syndrome: Secondary | ICD-10-CM

## 2021-04-03 DIAGNOSIS — Z68.41 Body mass index (BMI) pediatric, greater than or equal to 95th percentile for age: Secondary | ICD-10-CM

## 2021-04-03 DIAGNOSIS — N915 Oligomenorrhea, unspecified: Secondary | ICD-10-CM

## 2021-04-03 DIAGNOSIS — L709 Acne, unspecified: Secondary | ICD-10-CM

## 2021-04-03 LAB — POCT GLUCOSE (DEVICE FOR HOME USE): POC Glucose: 141 mg/dl — AB (ref 70–99)

## 2021-04-03 LAB — POCT GLYCOSYLATED HEMOGLOBIN (HGB A1C): Hemoglobin A1C: 5.3 % (ref 4.0–5.6)

## 2021-04-03 MED ORDER — NORETHIN ACE-ETH ESTRAD-FE 1.5-30 MG-MCG PO TABS: 1.0000 | ORAL_TABLET | Freq: Every day | ORAL | 6 refills | Status: DC

## 2021-04-03 NOTE — Patient Instructions (Signed)
It was a pleasure to see you in clinic today.   Feel free to contact our office during normal business hours at [phone] with questions or concerns. If you need us urgently after normal business hours, please call the above number to reach our answering service who will contact the on-call pediatric endocrinologist.  -Be active every day (at least 30 minutes of activity is ideal) -Don't drink your calories!  Drink water, white milk, or sugar-free drinks -Watch portion sizes -Reduce frequency of eating out    At [facility], we are committed to providing exceptional care. You will receive a patient satisfaction survey through text or email regarding your visit today. Your opinion is important to me. Comments are appreciated.  

## 2021-04-03 NOTE — Progress Notes (Signed)
Pediatric Endocrinology Consultation Follow-up Visit  Nancy Delgado Mar 01, 2001 [id number]  Chief Complaint: elevated testosterone/hyperandrogenism, insulin resistance, obesity, oligomenorrhea  HPI: Nancy Delgado is a 20 y.o. female presenting for follow-up of the above concerns.  she attended this visit alone.    1. Nancy Delgado had been followed in the past by Dr. Fransico Michael for management of obesity, elevated testosterone and androgen levels, and oligomenorrhea with last visit in 08/2015.  She then returned to our clinic and started following with me in 08/2017 for obesity, elevated androgen levels, and oligomenorrhea so she was started on OCPs in 08/2017.   2. Since last visit on 11/28/2020, she has been well.  Continues on Junel Fe 1.5/30  Withdrawal bleeding during week of inactive pills: yes Spotting: none Acne: none Hair growth: still having hair growth, no recent changes.  Shaves face once every 3-4 days (chin, upper lip) Smoking: No  Diet Changes: -Eating less these days.  Walks a lot at work [employer], [employer])  Activity: walking at work  Weight has decreased 7lb since last visit.   A1c is 5.3% today (was 5.7% at last visit).   Insulin resistance:  Mother, maternal aunt and MGM have T2DM (mom treated with metformin).  Sister treated with metformin  ROS All systems reviewed with pertinent positives listed below; otherwise negative.   Past Medical History:   Past Medical History:  Diagnosis Date  . Elevated testosterone level in female   . Hair abnormality    Hirsuitism  . Oligomenorrhea   . PCOS (polycystic ovarian syndrome)     Meds: Outpatient Encounter Medications as of 04/03/2021  Medication Sig  . DIFFERIN 0.3 % gel Apply topically 2 (two) times daily.  . [DISCONTINUED] norethindrone-ethinyl estradiol-iron (AUROVELA FE 1.5/30) 1.5-30 MG-MCG tablet Take 1 tablet by mouth daily.  . cetirizine (ZYRTEC) 10 MG tablet Take 10 mg by mouth at bedtime. (Patient not taking: No  sig reported)  . D3-50 1.25 MG (50000 UT) capsule Take 50,000 Units by mouth once a week. (Patient not taking: No sig reported)  . EUCRISA 2 % OINT SMARTSIG:Sparingly Topical Twice Daily (Patient not taking: No sig reported)  . FLUoxetine (PROZAC) 40 MG capsule Take 40 mg by mouth every morning.  Marland Kitchen ibuprofen (ADVIL,MOTRIN) 600 MG tablet Take 600 mg by mouth every 6 (six) hours as needed for headache. (Patient not taking: Reported on 04/03/2021)  . norethindrone-ethinyl estradiol-iron (AUROVELA FE 1.5/30) 1.5-30 MG-MCG tablet Take 1 tablet by mouth daily.  . traZODone (DESYREL) 50 MG tablet Take 50 mg by mouth at bedtime.   No facility-administered encounter medications on file as of 04/03/2021.   Allergies: No Known Allergies  Surgical History: Past Surgical History:  Procedure Laterality Date  . WISDOM TOOTH EXTRACTION        Family History:  Family History  Problem Relation Age of Onset  . Diabetes Mother        started metformin 08/2017  . Hypertension Maternal Grandmother    Mother has T2DM treated with metformin, also being evaluated for PCOS Maternal aunt has T2DM and PCOS with poor health MGM has T2DM Dad recently had a stroke; basically doing well but cannot go back to work as a Investment banker, operational and operate the machines  Social History: Attends [employer], living on campus.  Graduates 11/16/2021, then grad school  Physical Exam:  Vitals:   04/03/21 0929  BP: 114/66  Pulse: 70  Weight: 279 lb 12.8 oz (126.9 kg)   BP 114/66   Pulse 70   Wt  279 lb 12.8 oz (126.9 kg)   BMI 44.38 kg/m  Body mass index: body mass index is 44.38 kg/m. Growth percentile SmartLinks can only be used for patients less than 54 years old.  Wt Readings from Last 3 Encounters:  04/03/21 279 lb 12.8 oz (126.9 kg)  11/28/20 286 lb 3.2 oz (129.8 kg)  08/16/20 278 lb (126.1 kg) (>99 %, Z= 2.73)*   * Growth percentiles are based on CDC (Girls, 2-20 Years) data.   Ht Readings from Last 3 Encounters:  12/08/19  5' 6.58" (1.691 m) (82 %, Z= 0.90)*  08/03/19 5' 6.81" (1.697 m) (84 %, Z= 1.00)*  01/20/19 5' 6.58" (1.691 m) (82 %, Z= 0.92)*   * Growth percentiles are based on CDC (Girls, 2-20 Years) data.   General: Well developed, overweight female in no acute distress.  Appears stated age Head: Normocephalic, atraumatic.   Eyes:  Pupils equal and round. EOMI.   Sclera white.  No eye drainage.   Ears/Nose/Mouth/Throat: Masked Neck: supple, no cervical lymphadenopathy, no thyromegaly, + acanthosis nigricans on posterior neck  Cardiovascular: regular rate, normal S1/S2, no murmurs Respiratory: No increased work of breathing.  Lungs clear to auscultation bilaterally.  No wheezes. Abdomen: soft, nontender, nondistended.  Extremities: warm, well perfused, cap refill < 2 sec.   Musculoskeletal: Normal muscle mass.  Normal strength Skin: warm, dry.  No rash or lesions. Neurologic: alert and oriented, normal speech, no tremor   Labs: Results for orders placed or performed in visit on 04/03/21  POCT Glucose (Device for Home Use)  Result Value Ref Range   Glucose Fasting, POC     POC Glucose 141 (A) 70 - 99 mg/dl  POCT glycosylated hemoglobin (Hb A1C)  Result Value Ref Range   Hemoglobin A1C 5.3 4.0 - 5.6 %   HbA1c POC (<> result, manual entry)     HbA1c, POC (prediabetic range)     HbA1c, POC (controlled diabetic range)     Labs drawn by Melanie Crazier on 07/02/2017: Testosterone total 40 (<33) Free testosterone 11.3 (<3.7) Bioavailable testosterone 24.8 (<7.9) SHBG 7 (15-130)  Labs obtained by Melanie Crazier on 06/27/17: Total cholesterol 139 Triglycerides 58 HDL 49 LDL 77 Normal CMP (AST 15, ALT 25, BUN 12, Cr 0.71) TSH 1.43 (0.5-4.3) FT4 1.1 (0.8-1.4) 25-OH vitamin D 14 A1c 5.4%  Labs 05/29/15: HbA1c 5.4%, C-peptide 4.04 (normal 0.80-3.90); CBC with Hgb 12.8 and Hct 40.5, but MCV 74.2 and MCH 23.4; TSH 2.314, free T4 0.93, free T3 2.2; LH 11.9, FSH 6.2, testosterone 45, androstenedione 181  (normal 22 -225), DHEAS 307 (normal 37-307), estradiol 36.6, 17-OHP 45 (normal 16-283);   Labs 01/20/15: Dipstick urinalysis: normal  Labs 05/12/14: Testosterone 44 (normal < 30), SHBG 15 (normal 18-114), free testosterone 11.6 (normal 1.0-5.0); CBC with RBC 5.07, Hgb 11.9, Hct 37,1%, low MCV of 73.2, low MCH of 23.5; CMP normal; cholesterol 114, triglycerides 41, HDL 42, LDL 64; TSH 1.774; HbA1c 5.6%  Labs 01/21/14: CBC with RBC 5.29 (normal 3.8-5.2), Hgb 12.7, Hct 39.2%, low MCV 74.1, low MCH of 24.0; CMP normal; TSH 2.174, T4 8.4; HbA1c 5.4%.   Assessment/Plan: Nancy Delgado is a 20 y.o. female with history of oligomenorrhea and clinical/biochemical hyperandrogenism who is currently doing well on combination OCPs.  She also has a history of insulin resistance/elevated A1c to the pre-DM range and obesity.  She has had weight loss since last visit and improvement in A1c. She would continue to benefit from diet changes and increased physical activity as she  has a family history of T2DM.   1. Hyperandrogenism/ 2. Oligomenorrhea, unspecified type -Continue current OCP -New rx sent.  3. Insulin resistance/ 4.  Obesity due to excess calories without serious comorbidity with body mass index (BMI) in 95th to 98th percentile for age in pediatric patient -POC A1c and glucose as above -Encouraged physical activity -Encouraged healthy eating.  Follow-up:   Return in about 6 months (around 10/03/2021).   >30 minutes spent today reviewing the medical chart, counseling the patient/family, and documenting today's encounter.  Casimiro Needle, MD

## 2021-07-25 ENCOUNTER — Other Ambulatory Visit: Payer: Self-pay

## 2021-07-25 ENCOUNTER — Ambulatory Visit
Admission: RE | Admit: 2021-07-25 | Discharge: 2021-07-25 | Disposition: A | Discharge status: Home / Self Care | Payer: Medicaid Other | Source: Ambulatory Visit | Attending: Emergency Medicine | Admitting: Emergency Medicine

## 2021-07-25 VITALS — BP 122/84 | HR 72 | Temp 98.1°F | Resp 12

## 2021-07-25 DIAGNOSIS — R21 Rash and other nonspecific skin eruption: Secondary | ICD-10-CM | POA: Diagnosis not present

## 2021-07-25 HISTORY — DX: Dermatitis, unspecified: L30.9

## 2021-07-25 MED ORDER — HYDROXYZINE HCL 25 MG PO TABS: 25.0000 mg | ORAL_TABLET | Freq: Four times a day (QID) | ORAL | 0 refills | Status: DC

## 2021-07-25 MED ORDER — CETIRIZINE HCL 10 MG PO CAPS: 10.0000 mg | ORAL_CAPSULE | Freq: Every day | ORAL | 0 refills | Status: DC

## 2021-07-25 MED ORDER — PREDNISONE 10 MG PO TABS: ORAL_TABLET | ORAL | 0 refills | Status: DC

## 2021-07-25 NOTE — Discharge Instructions (Addendum)
Begin prednisone taper x6 days-begin with 6 tablets on day 1, decrease by 1 tablet each day until complete-6, 5, 4, 3, 2, 1-take with food and earlier in the day if possible Daily cetirizine in the morning supplement with hydroxyzine at home/bedtime from further itch relief, will cause drowsiness Please follow-up if not seeing any response to steroids with rash over the next 3 to 4 days

## 2021-07-25 NOTE — ED Provider Notes (Signed)
[facility]    CSN: [id number] Arrival date & time: 07/25/21  0914      History   Chief Complaint Chief Complaint  Patient presents with   Rash   APPT 0915    HPI Nancy Delgado is a 20 y.o. female presenting today for evaluation of a rash.  Rash began to develop approximately 1.5 weeks ago.  Rash started in right groin and spread to bilateral upper legs and chest.  Associated with itchiness.  Pain after showering.  Using steroid cream, CeraVe without relief.  Has history of eczema.  Denies any involvement of hands feet, palms or soles.  Denies close contacts with similar rash.  Denies history of similar with her eczema.  She denies any new soaps lotions, detergents or other hygiene products.  Denies any new foods or medicines.  Denies any shortness of breath or difficulty breathing.  Denies associated fevers, URI symptoms, arthralgias/myalgias, nausea vomiting, abdominal pain or dizziness or lightheadedness.  Denies any recent change in home environment, new furniture, or stays in hotels.  HPI  Past Medical History:  Diagnosis Date   Eczema    Elevated testosterone level in female    Hair abnormality    Hirsuitism   Oligomenorrhea    PCOS (polycystic ovarian syndrome)     Patient Active Problem List   Diagnosis Date Noted   Hyperandrogenism 09/29/2018   Acne 09/29/2018   Female hirsutism 05/29/2015   Morbid obesity (HCC) 05/29/2015   Insulin resistance 05/29/2015   Hyperinsulinemia 05/29/2015   Essential hypertension, benign 05/29/2015   Acquired acanthosis nigricans 05/29/2015   Dyspepsia 05/29/2015   Oligomenorrhea 05/29/2015   Goiter 05/29/2015    Past Surgical History:  Procedure Laterality Date   WISDOM TOOTH EXTRACTION      OB History   No obstetric history on file.      Home Medications    Prior to Admission medications   Medication Sig Start Date End Date Taking? Authorizing Provider  Cetirizine HCl 10 MG CAPS Take 1 capsule (10 mg total)  by mouth daily for 10 days. 07/25/21 08/04/21 Yes Savier Trickett C, PA-C  FLUoxetine (PROZAC) 40 MG capsule Take 40 mg by mouth every morning. 03/13/21  Yes [provider]  hydrOXYzine (ATARAX/VISTARIL) 25 MG tablet Take 1 tablet (25 mg total) by mouth every 6 (six) hours. 07/25/21  Yes Alfonzia Woolum C, PA-C  norethindrone-ethinyl estradiol-iron (AUROVELA FE 1.5/30) 1.5-30 MG-MCG tablet Take 1 tablet by mouth daily. 04/03/21  Yes Casimiro Needle, MD  predniSONE (DELTASONE) 10 MG tablet Begin with 6 tabs on day 1, 5 tab on day 2, 4 tab on day 3, 3 tab on day 4, 2 tab on day 5, 1 tab on day 6-take with food 07/25/21  Yes Trennon Torbeck C, PA-C  traZODone (DESYREL) 50 MG tablet Take 50 mg by mouth at bedtime. 03/04/21  Yes [provider]  D3-50 1.25 MG (50000 UT) capsule Take 50,000 Units by mouth once a week. Patient not taking: No sig reported 06/15/20   [provider]  DIFFERIN 0.3 % gel Apply topically 2 (two) times daily. 06/15/20   [provider]  EUCRISA 2 % OINT SMARTSIG:Sparingly Topical Twice Daily Patient not taking: No sig reported 06/15/20   [provider]    Family History Family History  Problem Relation Age of Onset   Diabetes Mother        started metformin 08/2017   Hypertension Maternal Grandmother     Social History  Social History   Tobacco Use   Smoking status: Never   Smokeless tobacco: Never  Substance Use Topics   Alcohol use: Never    Alcohol/week: 0.0 standard drinks   Drug use: Never     Allergies   Patient has no known allergies.   Review of Systems Review of Systems  Constitutional:  Negative for fatigue and fever.  HENT:  Negative for mouth sores.   Eyes:  Negative for visual disturbance.  Respiratory:  Negative for shortness of breath.   Cardiovascular:  Negative for chest pain.  Gastrointestinal:  Negative for abdominal pain, nausea and vomiting.  Genitourinary:  Negative for genital sores.   Musculoskeletal:  Negative for arthralgias and joint swelling.  Skin:  Positive for color change and rash. Negative for wound.  Neurological:  Negative for dizziness, weakness, light-headedness and headaches.    Physical Exam Triage Vital Signs ED Triage Vitals [07/25/21 0922]  Enc Vitals Group     BP      Pulse      Resp      Temp      Temp src      SpO2      Weight      Height      Head Circumference      Peak Flow      Pain Score 0     Pain Loc      Pain Edu?      Excl. in GC?    No data found.  Updated Vital Signs BP 122/84 (BP Location: Right Arm)   Pulse 72   Temp 98.1 F (36.7 C) (Oral)   Resp 12   LMP 06/24/2021 (Approximate)   SpO2 96%   Visual Acuity Right Eye Distance:   Left Eye Distance:   Bilateral Distance:    Right Eye Near:   Left Eye Near:    Bilateral Near:     Physical Exam Vitals and nursing note reviewed.  Constitutional:      Appearance: She is well-developed.     Comments: No acute distress  HENT:     Head: Normocephalic and atraumatic.     Nose: Nose normal.  Eyes:     Conjunctiva/sclera: Conjunctivae normal.  Cardiovascular:     Rate and Rhythm: Normal rate.  Pulmonary:     Effort: Pulmonary effort is normal. No respiratory distress.  Abdominal:     General: There is no distension.  Musculoskeletal:        General: Normal range of motion.     Cervical back: Neck supple.  Skin:    General: Skin is warm and dry.     Comments: Erythematous small papular rash noted to bilateral proximal lower extremities extending into abdomen and left neck/chest, minimal involvement of back, no involvement of palms/upper extremities, no involvement of face  Neurological:     Mental Status: She is alert and oriented to person, place, and time.     UC Treatments / Results  Labs (all labs ordered are listed, but only abnormal results are displayed) Labs Reviewed - No data to display  EKG   Radiology No results  found.  Procedures Procedures (including critical care time)  Medications Ordered in UC Medications - No data to display  Initial Impression / Assessment and Plan / UC Course  I have reviewed the triage vital signs and the nursing notes.  Pertinent labs & imaging results that were available during my care of the patient were reviewed by me and  considered in my medical decision making (see chart for details).     Rash does not appear infectious, lower suspicion of bacterial or fungal cause, seems more likely allergic or inflammatory, treating for possible allergic versus contact dermatitis with prednisone taper x6 days along with antihistamines, continue to monitor,Discussed strict return precautions. Patient verbalized understanding and is agreeable with plan.  Final Clinical Impressions(s) / UC Diagnoses   Final diagnoses:  Rash and nonspecific skin eruption     Discharge Instructions      Begin prednisone taper x6 days-begin with 6 tablets on day 1, decrease by 1 tablet each day until complete-6, 5, 4, 3, 2, 1-take with food and earlier in the day if possible Daily cetirizine in the morning supplement with hydroxyzine at home/bedtime from further itch relief, will cause drowsiness Please follow-up if not seeing any response to steroids with rash over the next 3 to 4 days     ED Prescriptions     Medication Sig Dispense Auth. Provider   predniSONE (DELTASONE) 10 MG tablet Begin with 6 tabs on day 1, 5 tab on day 2, 4 tab on day 3, 3 tab on day 4, 2 tab on day 5, 1 tab on day 6-take with food 21 tablet Makaiah Terwilliger C, PA-C   Cetirizine HCl 10 MG CAPS Take 1 capsule (10 mg total) by mouth daily for 10 days. 10 capsule Caster Fayette C, PA-C   hydrOXYzine (ATARAX/VISTARIL) 25 MG tablet Take 1 tablet (25 mg total) by mouth every 6 (six) hours. 12 tablet Denay Pleitez, [place] C, PA-C      PDMP not reviewed this encounter.   Lew Dawes, [place] 07/25/21 [phone]

## 2021-07-25 NOTE — ED Triage Notes (Addendum)
Patient c/o rash x 1.5 weeks ago.   Patient endorses rash started in RT groin and has now spread to RT upper leg, LFT upper leg, and chest.   Patient endorses itchiness. Patient endorses pain "when getting out of the shower".   Patient has used a steroid cream and CeriVe moisturizing lotion w/ no relief of symptoms.   History of Eczema.

## 2021-10-03 ENCOUNTER — Other Ambulatory Visit: Payer: Self-pay

## 2021-10-03 ENCOUNTER — Ambulatory Visit (INDEPENDENT_AMBULATORY_CARE_PROVIDER_SITE_OTHER): Payer: Medicaid Other | Admitting: Pediatrics

## 2021-10-03 ENCOUNTER — Encounter (INDEPENDENT_AMBULATORY_CARE_PROVIDER_SITE_OTHER): Payer: Self-pay | Admitting: Pediatrics

## 2021-10-03 VITALS — BP 128/76 | HR 80 | Wt 285.8 lb

## 2021-10-03 DIAGNOSIS — Z6841 Body Mass Index (BMI) 40.0 and over, adult: Secondary | ICD-10-CM

## 2021-10-03 DIAGNOSIS — N915 Oligomenorrhea, unspecified: Secondary | ICD-10-CM

## 2021-10-03 DIAGNOSIS — E288 Other ovarian dysfunction: Secondary | ICD-10-CM | POA: Diagnosis not present

## 2021-10-03 DIAGNOSIS — E8881 Metabolic syndrome: Secondary | ICD-10-CM | POA: Diagnosis not present

## 2021-10-03 LAB — POCT GLUCOSE (DEVICE FOR HOME USE): Glucose Fasting, POC: 99 mg/dL (ref 70–99)

## 2021-10-03 LAB — POCT GLYCOSYLATED HEMOGLOBIN (HGB A1C): Hemoglobin A1C: 5.4 % (ref 4.0–5.6)

## 2021-10-03 MED ORDER — NORETHIN ACE-ETH ESTRAD-FE 1.5-30 MG-MCG PO TABS: 1.0000 | ORAL_TABLET | Freq: Every day | ORAL | 6 refills | Status: DC

## 2021-10-03 NOTE — Patient Instructions (Signed)

## 2021-10-03 NOTE — Progress Notes (Signed)
Pediatric Endocrinology Consultation Follow-up Visit  Jaliya Siegmann 2001/06/18 [id number]  Chief Complaint: elevated testosterone/hyperandrogenism, insulin resistance, obesity, oligomenorrhea  HPI: Nancy Delgado is a 20 y.o. female presenting for follow-up of the above concerns.  she attended this visit alone.    1. Casilda had been followed in the past by Dr. Fransico Michael for management of obesity, elevated testosterone and androgen levels, and oligomenorrhea with last visit in 08/2015.  She then returned to our clinic and started following with me in 08/2017 for obesity, elevated androgen levels, and oligomenorrhea so she was started on OCPs in 08/2017.   2. Since last visit on 04/03/21, she has been well.  Continues on Junel Fe 1.5/30  Withdrawal bleeding during week of inactive pills: yes Spotting: none Acne: not much.  Mainly just scarring on lower cheeks currently. Hair growth: has slowed down a little.  Getting face waxed every 2 weeks Smoking: No  Diet Changes: -Eating is currently "not good".  Eating more, could work on the amount of food she is eating.  Could also work on eating at proper times. Usually does not eat until 1PM, then eats again when she gets off work at 10:45PM. -Mostly coke to drink. Will work on changing to diet coke.  Activity: active at work.  At school has a class that involves hiking.  Taking her dog to the park.    Weight has increased 6lb since last visit.   A1c is 5.4% today (was 5.3% at last visit).   Insulin resistance:  Mother, maternal aunt and MGM have T2DM (mom treated with metformin).  Sister treated with metformin  ROS All systems reviewed with pertinent positives listed below; otherwise negative.  Was having to urinate frequently in the recent past.  Has not increased drinking.  Not really waking overnight to urinate.  Back to normal now.   No dysuria, no fevers.     Past Medical History:   Past Medical History:  Diagnosis Date   Eczema    Elevated  testosterone level in female    Hair abnormality    Hirsuitism   Oligomenorrhea    PCOS (polycystic ovarian syndrome)     Meds: Outpatient Encounter Medications as of 10/03/2021  Medication Sig   FLUoxetine (PROZAC) 20 MG capsule Take by mouth.   FLUoxetine (PROZAC) 40 MG capsule Take 40 mg by mouth every morning.   norethindrone-ethinyl estradiol-iron (AUROVELA FE 1.5/30) 1.5-30 MG-MCG tablet Take 1 tablet by mouth daily.   traZODone (DESYREL) 50 MG tablet Take 50 mg by mouth at bedtime.   triamcinolone ointment (KENALOG) 0.1 % SMARTSIG:Sparingly Topical Twice Daily   Cetirizine HCl 10 MG CAPS Take 1 capsule (10 mg total) by mouth daily for 10 days. (Patient not taking: Reported on 10/03/2021)   DIFFERIN 0.3 % gel Apply topically 2 (two) times daily. (Patient not taking: Reported on 10/03/2021)   [DISCONTINUED] D3-50 1.25 MG (50000 UT) capsule Take 50,000 Units by mouth once a week. (Patient not taking: No sig reported)   [DISCONTINUED] EUCRISA 2 % OINT SMARTSIG:Sparingly Topical Twice Daily (Patient not taking: No sig reported)   [DISCONTINUED] hydrOXYzine (ATARAX/VISTARIL) 25 MG tablet Take 1 tablet (25 mg total) by mouth every 6 (six) hours.   [DISCONTINUED] predniSONE (DELTASONE) 10 MG tablet Begin with 6 tabs on day 1, 5 tab on day 2, 4 tab on day 3, 3 tab on day 4, 2 tab on day 5, 1 tab on day 6-take with food   No facility-administered encounter medications on file as  of 10/03/2021.   Allergies: No Known Allergies  Surgical History: Past Surgical History:  Procedure Laterality Date   WISDOM TOOTH EXTRACTION        Family History:  Family History  Problem Relation Age of Onset   Diabetes Mother        started metformin 08/2017   Hypertension Maternal Grandmother    Mother has T2DM treated with metformin, also being evaluated for PCOS Maternal aunt has T2DM and PCOS with poor health MGM has T2DM Dad with hx of stroke  Social History: Attends [employer], living on campus.   Graduates 11/16/2021, then grad school for Masters of clinical mental health, hoping to get into [employer] program that starts summer 2023.    Physical Exam:  Vitals:   10/03/21 0922  BP: 128/76  Pulse: 80  Weight: 285 lb 12.8 oz (129.6 kg)    BP 128/76   Pulse 80   Wt 285 lb 12.8 oz (129.6 kg)   LMP 08/28/2021 (Approximate)   BMI 45.34 kg/m  Body mass index: body mass index is 45.34 kg/m. Growth percentile SmartLinks can only be used for patients less than 54 years old.  Wt Readings from Last 3 Encounters:  10/03/21 285 lb 12.8 oz (129.6 kg)  04/03/21 279 lb 12.8 oz (126.9 kg)  11/28/20 286 lb 3.2 oz (129.8 kg)   Ht Readings from Last 3 Encounters:  12/08/19 5' 6.58" (1.691 m) (82 %, Z= 0.90)*  08/03/19 5' 6.81" (1.697 m) (84 %, Z= 1.00)*  01/20/19 5' 6.58" (1.691 m) (82 %, Z= 0.92)*   * Growth percentiles are based on CDC (Girls, 2-20 Years) data.   General: Well developed, overweight female in no acute distress.  Appears stated age Head: Normocephalic, atraumatic.   Eyes:  Pupils equal and round. EOMI.   Sclera white.  No eye drainage.  Wearing glasses. Ears/Nose/Mouth/Throat: Masked.  Removed mask briefly to show facial skin- mild scarring on lower face without significant redness.  Stubble on chin Neck: supple, no cervical lymphadenopathy, no thyromegaly, + acanthosis nigricans on neck  Cardiovascular: regular rate, normal S1/S2, no murmurs Respiratory: No increased work of breathing.  Lungs clear to auscultation bilaterally.  No wheezes. Abdomen: soft, nontender, nondistended.  Extremities: warm, well perfused, cap refill < 2 sec.   Musculoskeletal: Normal muscle mass.  Normal strength Skin: warm, dry.  No rash or lesions. Neurologic: alert and oriented, normal speech, no tremor   Labs: Results for orders placed or performed in visit on 10/03/21  POCT Glucose (Device for Home Use)  Result Value Ref Range   Glucose Fasting, POC 99 70 - 99 mg/dL   POC Glucose     POCT glycosylated hemoglobin (Hb A1C)  Result Value Ref Range   Hemoglobin A1C 5.4 4.0 - 5.6 %   HbA1c POC (<> result, manual entry)     HbA1c, POC (prediabetic range)     HbA1c, POC (controlled diabetic range)     Labs drawn by Melanie Crazier on 07/02/2017: Testosterone total 40 (<33) Free testosterone 11.3 (<3.7) Bioavailable testosterone 24.8 (<7.9) SHBG 7 (15-130)  Labs obtained by Melanie Crazier on 06/27/17: Total cholesterol 139 Triglycerides 58 HDL 49 LDL 77 Normal CMP (AST 15, ALT 25, BUN 12, Cr 0.71) TSH 1.43 (0.5-4.3) FT4 1.1 (0.8-1.4) 25-OH vitamin D 14 A1c 5.4%  Labs 05/29/15: HbA1c 5.4%, C-peptide 4.04 (normal 0.80-3.90); CBC with Hgb 12.8 and Hct 40.5, but MCV 74.2 and MCH 23.4; TSH 2.314, free T4 0.93, free T3 2.2; LH 11.9, FSH  6.2, testosterone 45, androstenedione 181 (normal 22 -225), DHEAS 307 (normal 37-307), estradiol 36.6, 17-OHP 45 (normal 16-283);    Labs 01/20/15: Dipstick urinalysis: normal   Labs 05/12/14: Testosterone 44 (normal < 30), SHBG 15 (normal 18-114), free testosterone 11.6 (normal 1.0-5.0); CBC with RBC 5.07, Hgb 11.9, Hct 37,1%, low MCV of 73.2, low MCH of 23.5; CMP normal; cholesterol 114, triglycerides 41, HDL 42, LDL 64; TSH 1.774; HbA1c 5.6%   Labs 01/21/14: CBC with RBC 5.29 (normal 3.8-5.2), Hgb 12.7, Hct 39.2%, low MCV 74.1, low MCH of 24.0; CMP normal; TSH 2.174, T4 8.4; HbA1c 5.4%.   Assessment/Plan: Jaliah is a 20 y.o. female with history of oligomenorrhea and clinical/biochemical hyperandrogenism who is currently doing well on combination OCPs.  She also has a history of insulin resistance/elevated A1c to the pre-DM range and obesity.  A1c is normal today despite increased weight.  She would continue to benefit from increased physical activity and diet changes (eliminating sugar drinks).   1. Hyperandrogenism/ 2. Oligomenorrhea, unspecified type -Continue current OCP -New rx sent. -Explained that I will see her again in 6 months though  at that time I will have to send her to an adult provider.  I do not think she specifically needs an endocrinologist, but rather a primary care provider could continue to monitor her A1c/weight and continue her current OCP.  3. Insulin resistance/ 4.  Class 3 obesity without serious comorbidity with BMI 45 -POC A1c and glucose as above.  Explained that A1c is normal.  -Encouraged to change to sugar free sodas -Discussed regular meal times -Increased physical activity  Follow-up:   Return in about 6 months (around 04/03/2022).   >30 minutes spent today reviewing the medical chart, counseling the patient/family, and documenting today's encounter.   Casimiro Needle, MD

## 2022-04-03 ENCOUNTER — Ambulatory Visit (INDEPENDENT_AMBULATORY_CARE_PROVIDER_SITE_OTHER): Payer: Medicaid Other | Admitting: Pediatrics

## 2022-04-03 NOTE — Patient Instructions (Incomplete)

## 2022-04-03 NOTE — Progress Notes (Deleted)
Pediatric Endocrinology Consultation Follow-up Visit  Dela Heimann 2001-10-02 [id number]  Chief Complaint: elevated testosterone/hyperandrogenism, insulin resistance, obesity, oligomenorrhea  HPI: Akshata Dickinson is a 21 y.o. female presenting for follow-up of the above concerns.  she attended this visit ***alone.    1. Jerelene had been followed in the past by Dr. Fransico Michael for management of obesity, elevated testosterone and androgen levels, and oligomenorrhea with last visit in 08/2015.  She then returned to our clinic and started following with me in 08/2017 for obesity, elevated androgen levels, and oligomenorrhea so she was started on OCPs in 08/2017.   2. Since last visit on 10/03/21, she has been ***well.  Continues on Junel Fe 1.5/30  Withdrawal bleeding during week of inactive pills: yes*** Spotting: none*** Acne: not much.  Mainly just scarring on lower cheeks currently.*** Hair growth: has slowed down a little.  Getting face waxed every 2 weeks*** Smoking: No***  Diet Changes: -***  Activity: a***  Weight has increased ***lb since last visit.   A1c is ***% today (was 5.4% at last visit).   Insulin resistance:  Mother, maternal aunt and MGM have T2DM (mom treated with metformin).  Sister treated with metformin  ROS All systems reviewed with pertinent positives listed below; otherwise negative.     Past Medical History:   Past Medical History:  Diagnosis Date   Eczema    Elevated testosterone level in female    Hair abnormality    Hirsuitism   Oligomenorrhea    PCOS (polycystic ovarian syndrome)     Meds: Outpatient Encounter Medications as of 04/03/2022  Medication Sig   Cetirizine HCl 10 MG CAPS Take 1 capsule (10 mg total) by mouth daily for 10 days. (Patient not taking: Reported on 10/03/2021)   DIFFERIN 0.3 % gel Apply topically 2 (two) times daily. (Patient not taking: Reported on 10/03/2021)   FLUoxetine (PROZAC) 20 MG capsule Take by mouth.   FLUoxetine (PROZAC)  40 MG capsule Take 40 mg by mouth every morning.   norethindrone-ethinyl estradiol-iron (AUROVELA FE 1.5/30) 1.5-30 MG-MCG tablet Take 1 tablet by mouth daily.   traZODone (DESYREL) 50 MG tablet Take 50 mg by mouth at bedtime.   triamcinolone ointment (KENALOG) 0.1 % SMARTSIG:Sparingly Topical Twice Daily   No facility-administered encounter medications on file as of 04/03/2022.   Allergies: No Known Allergies  Surgical History: Past Surgical History:  Procedure Laterality Date   WISDOM TOOTH EXTRACTION        Family History:  Family History  Problem Relation Age of Onset   Diabetes Mother        started metformin 08/2017   Hypertension Maternal Grandmother    Mother has T2DM treated with metformin, also being evaluated for PCOS Maternal aunt has T2DM and PCOS with poor health MGM has T2DM Dad with hx of stroke  Social History: Attends [employer], living on campus.  Graduates 11/16/2021, then grad school for Masters of clinical mental health, hoping to get into [employer] program that starts summer 2023.    Physical Exam:  There were no vitals filed for this visit.   There were no vitals taken for this visit. Body mass index: body mass index is unknown because there is no height or weight on file. Growth percentile SmartLinks can only be used for patients less than 25 years old.  Wt Readings from Last 3 Encounters:  10/03/21 285 lb 12.8 oz (129.6 kg)  04/03/21 279 lb 12.8 oz (126.9 kg)  11/28/20 286 lb 3.2 oz (129.8 kg)  Ht Readings from Last 3 Encounters:  12/08/19 5' 6.58" (1.691 m) (82 %, Z= 0.90)*  08/03/19 5' 6.81" (1.697 m) (84 %, Z= 1.00)*  01/20/19 5' 6.58" (1.691 m) (82 %, Z= 0.92)*   * Growth percentiles are based on CDC (Girls, 2-20 Years) data.   General: Well developed, well nourished ***female in no acute distress.  Appears *** stated age Head: Normocephalic, atraumatic.   Eyes:  Pupils equal and round. EOMI.   Sclera white.  No eye drainage.    Ears/Nose/Mouth/Throat: Nares patent, no nasal drainage.  Normal dentition, mucous membranes moist.   Neck: supple, no cervical lymphadenopathy, no thyromegaly Cardiovascular: regular rate, normal S1/S2, no murmurs Respiratory: No increased work of breathing.  Lungs clear to auscultation bilaterally.  No wheezes. Abdomen: soft, nontender, nondistended. No appreciable masses  Extremities: warm, well perfused, cap refill < 2 sec.   Musculoskeletal: Normal muscle mass.  Normal strength Skin: warm, dry.  No rash or lesions. Neurologic: alert and oriented, normal speech, no tremor   Labs: Results for orders placed or performed in visit on 10/03/21  POCT Glucose (Device for Home Use)  Result Value Ref Range   Glucose Fasting, POC 99 70 - 99 mg/dL   POC Glucose    POCT glycosylated hemoglobin (Hb A1C)  Result Value Ref Range   Hemoglobin A1C 5.4 4.0 - 5.6 %   HbA1c POC (<> result, manual entry)     HbA1c, POC (prediabetic range)     HbA1c, POC (controlled diabetic range)     Labs drawn by Maudry Mayhew on 07/02/2017: Testosterone total 40 (<33) Free testosterone 11.3 (<3.7) Bioavailable testosterone 24.8 (<7.9) SHBG 7 (15-130)  Labs obtained by Maudry Mayhew on 06/27/17: Total cholesterol 139 Triglycerides 58 HDL 49 LDL 77 Normal CMP (AST 15, ALT 25, BUN 12, Cr 0.71) TSH 1.43 (0.5-4.3) FT4 1.1 (0.8-1.4) 25-OH vitamin D 14 A1c 5.4%  Labs 05/29/15: HbA1c 5.4%, C-peptide 4.04 (normal 0.80-3.90); CBC with Hgb 12.8 and Hct 40.5, but MCV 74.2 and MCH 23.4; TSH 2.314, free T4 0.93, free T3 2.2; LH 11.9, FSH 6.2, testosterone 45, androstenedione 181 (normal 22 -225), DHEAS 307 (normal 37-307), estradiol 36.6, 17-OHP 45 (normal 16-283);    Labs 01/20/15: Dipstick urinalysis: normal   Labs 05/12/14: Testosterone 44 (normal < 30), SHBG 15 (normal 18-114), free testosterone 11.6 (normal 1.0-5.0); CBC with RBC 5.07, Hgb 11.9, Hct 37,1%, low MCV of 73.2, low MCH of 23.5; CMP normal; cholesterol  114, triglycerides 41, HDL 42, LDL 64; TSH 1.774; HbA1c 5.6%   Labs 01/21/14: CBC with RBC 5.29 (normal 3.8-5.2), Hgb 12.7, Hct 39.2%, low MCV 74.1, low MCH of 24.0; CMP normal; TSH 2.174, T4 8.4; HbA1c 5.4%.   Assessment/Plan:*** Latorria is a 21 y.o. female with history of oligomenorrhea and clinical/biochemical hyperandrogenism who is currently doing well on combination OCPs.  She also has a history of insulin resistance/elevated A1c to the pre-DM range and obesity.  A1c is normal today despite increased weight.  She would continue to benefit from increased physical activity and diet changes (eliminating sugar drinks).   1. Hyperandrogenism/ 2. Oligomenorrhea, unspecified type -Continue current OCP -New rx sent. -Explained that I will see her again in 6 months though at that time I will have to send her to an adult provider.  I do not think she specifically needs an endocrinologist, but rather a primary care provider could continue to monitor her A1c/weight and continue her current OCP.  3. Insulin resistance/ 4.  Class 3 obesity  without serious comorbidity with BMI 45 -POC A1c and glucose as above.  Explained that A1c is normal.  -Encouraged to change to sugar free sodas -Discussed regular meal times -Increased physical activity  Follow-up:   No follow-ups on file.   ***   Levon Hedger, MD

## 2022-04-23 ENCOUNTER — Ambulatory Visit (INDEPENDENT_AMBULATORY_CARE_PROVIDER_SITE_OTHER): Payer: Medicaid Other | Admitting: Pediatrics

## 2022-04-23 ENCOUNTER — Encounter (INDEPENDENT_AMBULATORY_CARE_PROVIDER_SITE_OTHER): Payer: Self-pay | Admitting: Pediatrics

## 2022-04-23 VITALS — BP 118/76 | HR 78 | Wt 294.2 lb

## 2022-04-23 DIAGNOSIS — E288 Other ovarian dysfunction: Secondary | ICD-10-CM | POA: Diagnosis not present

## 2022-04-23 DIAGNOSIS — N915 Oligomenorrhea, unspecified: Secondary | ICD-10-CM | POA: Diagnosis not present

## 2022-04-23 DIAGNOSIS — Z6841 Body Mass Index (BMI) 40.0 and over, adult: Secondary | ICD-10-CM

## 2022-04-23 DIAGNOSIS — E8881 Metabolic syndrome: Secondary | ICD-10-CM

## 2022-04-23 LAB — POCT GLUCOSE (DEVICE FOR HOME USE): Glucose Fasting, POC: 100 mg/dL — AB (ref 70–99)

## 2022-04-23 MED ORDER — NORETHIN ACE-ETH ESTRAD-FE 1.5-30 MG-MCG PO TABS: 1.0000 | ORAL_TABLET | Freq: Every day | ORAL | 11 refills | Status: AC

## 2022-04-23 NOTE — Progress Notes (Signed)
Pediatric Endocrinology Consultation Follow-up Visit ? ?Nancy Delgado ?January 08, 2001 ?[id number] ? ?Chief Complaint: elevated testosterone/hyperandrogenism, insulin resistance, obesity, oligomenorrhea ? ?HPI: ?Nancy Delgado is a 21 y.o. female presenting for follow-up of the above concerns.  she attended this visit alone.   ? ?1. Nancy Delgado had been followed in the past by Dr. Fransico Michael for management of obesity, elevated testosterone and androgen levels, and oligomenorrhea with last visit in 08/2015.  She then returned to our clinic and started following with me in 08/2017 for obesity, elevated androgen levels, and oligomenorrhea so she was started on OCPs in 08/2017.  ? ?2. Since last visit on 10/03/21, she has been well. ? ?Stopped taking fluoxetine.  Cousin overdosed and passed on Thanksgiving.  Restarted fluoxetine 2 weeks ago.  Hungry all the time.   ? ?Continues on Junel Fe 1.5/30  ?Withdrawal bleeding during week of inactive pills: yes ?Spotting: none ?Acne: a little ?Hair growth: better.  Waxing chin every 2-3 weeks.  ?Smoking: No ? ?Diet Changes: ?-Not good.  Drinking water or occasional soda.  ?-Eating out a lot recently.  ? ?Activity: None.  Can be more active.   ? ?Weight has increased 9lb since last visit.   ?A1c was 5.4% at last visit.  ? ?Insulin resistance:  Mother, maternal aunt and MGM have T2DM (mom treated with metformin).  Sister treated with metformin ? ?ROS ?All systems reviewed with pertinent positives listed below; otherwise negative.   ?  ?Past Medical History:   ?Past Medical History:  ?Diagnosis Date  ? Eczema   ? Elevated testosterone level in female   ? Hair abnormality   ? Hirsuitism  ? Oligomenorrhea   ? PCOS (polycystic ovarian syndrome)   ? ? ?Meds: ?Outpatient Encounter Medications as of 04/23/2022  ?Medication Sig  ? brexpiprazole (REXULTI) 1 MG TABS tablet Take 1 mg by mouth daily.  ? FLUoxetine (PROZAC) 20 MG capsule Take by mouth.  ? FLUoxetine (PROZAC) 40 MG capsule Take 40 mg by mouth every  morning.  ? traZODone (DESYREL) 50 MG tablet Take 50 mg by mouth at bedtime.  ? triamcinolone ointment (KENALOG) 0.1 % SMARTSIG:Sparingly Topical Twice Daily  ? [DISCONTINUED] Cetirizine HCl 10 MG CAPS Take 1 capsule (10 mg total) by mouth daily for 10 days.  ? [DISCONTINUED] DIFFERIN 0.3 % gel Apply topically 2 (two) times daily.  ? [DISCONTINUED] norethindrone-ethinyl estradiol-iron (AUROVELA FE 1.5/30) 1.5-30 MG-MCG tablet Take 1 tablet by mouth daily.  ? norethindrone-ethinyl estradiol-iron (AUROVELA FE 1.5/30) 1.5-30 MG-MCG tablet Take 1 tablet by mouth daily.  ? ?No facility-administered encounter medications on file as of 04/23/2022.  ? ?Allergies: ?No Known Allergies ? ?Surgical History: ?Past Surgical History:  ?Procedure Laterality Date  ? WISDOM TOOTH EXTRACTION    ?   ? ?Family History:  ?Family History  ?Problem Relation Age of Onset  ? Diabetes Mother   ?     started metformin 08/2017  ? Hypertension Maternal Grandmother   ? ?Mother has T2DM treated with metformin, also being evaluated for PCOS ?Maternal aunt has T2DM and PCOS with poor health ?MGM has T2DM ?Dad with hx of stroke ? ?Social History: ?Going to school at [employer] in the fall.  Working over the summer.  Lives at home ? ?Physical Exam:  ?Vitals:  ? 04/23/22 1002  ?BP: 118/76  ?Pulse: 78  ?Weight: 294 lb 3.2 oz (133.4 kg)  ? ?BP 118/76   Pulse 78   Wt 294 lb 3.2 oz (133.4 kg)   BMI 46.67 kg/m?  ?  Body mass index: body mass index is 46.67 kg/m?Marland Kitchen ?Growth percentile SmartLinks can only be used for patients less than 42 years old. ? ?Wt Readings from Last 3 Encounters:  ?04/23/22 294 lb 3.2 oz (133.4 kg)  ?10/03/21 285 lb 12.8 oz (129.6 kg)  ?04/03/21 279 lb 12.8 oz (126.9 kg)  ? ?Ht Readings from Last 3 Encounters:  ?12/08/19 5' 6.58" (1.691 m) (82 %, Z= 0.90)*  ?08/03/19 5' 6.81" (1.697 m) (84 %, Z= 1.00)*  ?01/20/19 5' 6.58" (1.691 m) (82 %, Z= 0.92)*  ? ?* Growth percentiles are based on CDC (Girls, 2-20 Years) data.  ? ?General: Well developed,  obese female in no acute distress.  Appears stated age ?Head: Normocephalic, atraumatic.   ?Eyes:  Pupils equal and round. EOMI.   Sclera white.  No eye drainage.   ?Ears/Nose/Mouth/Throat: Nares patent, no nasal drainage.  Normal dentition, mucous membranes moist.   ?Neck: supple, no cervical lymphadenopathy, no thyromegaly, + acanthosis nigricans on posterior neck  ?Cardiovascular: regular rate, normal S1/S2, no murmurs ?Respiratory: No increased work of breathing.  Lungs clear to auscultation bilaterally.  No wheezes. ?Abdomen: soft, nontender, nondistended. No appreciable masses  ?Extremities: warm, well perfused, cap refill < 2 sec.   ?Musculoskeletal: Normal muscle mass.  Normal strength ?Skin: warm, dry.  No rash or lesions. ?Neurologic: alert and oriented, normal speech, no tremor  ? ?Labs: ?Results for orders placed or performed in visit on 04/23/22  ?POCT Glucose (Device for Home Use)  ?Result Value Ref Range  ? Glucose Fasting, POC 100 (A) 70 - 99 mg/dL  ? POC Glucose    ? ?Labs drawn by Melanie Crazier on 07/02/2017: ?Testosterone total 40 (<33) ?Free testosterone 11.3 (<3.7) ?Bioavailable testosterone 24.8 (<7.9) ?SHBG 7 (15-130) ? ?Labs obtained by Melanie Crazier on 06/27/17: ?Total cholesterol 139 ?Triglycerides 58 ?HDL 49 ?LDL 77 ?Normal CMP (AST 15, ALT 25, BUN 12, Cr 0.71) ?TSH 1.43 (0.5-4.3) ?FT4 1.1 (0.8-1.4) ?25-OH vitamin D 14 ?A1c 5.4% ? ?Labs 05/29/15: HbA1c 5.4%, C-peptide 4.04 (normal 0.80-3.90); CBC with Hgb 12.8 and Hct 40.5, but MCV 74.2 and MCH 23.4; TSH 2.314, free T4 0.93, free T3 2.2; LH 11.9, FSH 6.2, testosterone 45, androstenedione 181 (normal 22 -225), DHEAS 307 (normal 37-307), estradiol 36.6, 17-OHP 45 (normal 16-283);  ?  ?Labs 01/20/15: Dipstick urinalysis: normal ?  ?Labs 05/12/14: Testosterone 44 (normal < 30), SHBG 15 (normal 18-114), free testosterone 11.6 (normal 1.0-5.0); CBC with RBC 5.07, Hgb 11.9, Hct 37,1%, low MCV of 73.2, low MCH of 23.5; CMP normal; cholesterol 114,  triglycerides 41, HDL 42, LDL 64; TSH 1.774; HbA1c 5.6% ?  ?Labs 01/21/14: CBC with RBC 5.29 (normal 3.8-5.2), Hgb 12.7, Hct 39.2%, low MCV 74.1, low MCH of 24.0; CMP normal; TSH 2.174, T4 8.4; HbA1c 5.4%.  ? ?Assessment/Plan: ?Estel is a 21 y.o. female with history of oligomenorrhea and clinical/biochemical hyperandrogenism who is currently doing well on combination OCPs.  She also has a history of insulin resistance/elevated A1c to the pre-DM range and obesity.  A1c was normal at last visit.  She has had weight gain and would benefit from lifestyle modifications. ? ?1. Hyperandrogenism/ ?2. Oligomenorrhea, unspecified type ?-Continue current OCP ?-New rx sent. ?-Advised that she should follow-up with adult primary care provider ? ?3. Insulin resistance/ ?4.  Class 3 obesity without serious comorbidity with BMI 46.67 ?-Encouraged healthy diet and increased physical activity ? ?Follow-up:   Return if symptoms worsen or fail to improve.  ? ?>30 minutes spent today reviewing  the medical chart, counseling the patient/family, and documenting today's encounter. ? ?Casimiro NeedleAshley Bashioum Vicie Cech, MD ?

## 2022-04-23 NOTE — Patient Instructions (Signed)

## 2022-07-29 ENCOUNTER — Encounter (HOSPITAL_BASED_OUTPATIENT_CLINIC_OR_DEPARTMENT_OTHER): Payer: Self-pay

## 2022-07-29 DIAGNOSIS — R5383 Other fatigue: Secondary | ICD-10-CM

## 2022-07-29 DIAGNOSIS — R0683 Snoring: Secondary | ICD-10-CM

## 2022-07-29 DIAGNOSIS — R0681 Apnea, not elsewhere classified: Secondary | ICD-10-CM

## 2022-11-11 ENCOUNTER — Other Ambulatory Visit (INDEPENDENT_AMBULATORY_CARE_PROVIDER_SITE_OTHER): Payer: Self-pay | Admitting: Pediatrics

## 2022-11-11 DIAGNOSIS — E288 Other ovarian dysfunction: Secondary | ICD-10-CM

## 2022-11-11 DIAGNOSIS — N915 Oligomenorrhea, unspecified: Secondary | ICD-10-CM

## 2022-12-30 ENCOUNTER — Encounter (HOSPITAL_BASED_OUTPATIENT_CLINIC_OR_DEPARTMENT_OTHER): Payer: Self-pay

## 2022-12-30 DIAGNOSIS — R0683 Snoring: Secondary | ICD-10-CM

## 2022-12-30 DIAGNOSIS — G4733 Obstructive sleep apnea (adult) (pediatric): Secondary | ICD-10-CM

## 2022-12-30 DIAGNOSIS — R5383 Other fatigue: Secondary | ICD-10-CM

## 2022-12-30 DIAGNOSIS — G4710 Hypersomnia, unspecified: Secondary | ICD-10-CM

## 2023-06-17 ENCOUNTER — Encounter: Payer: Self-pay | Admitting: Neurology

## 2023-10-08 NOTE — Progress Notes (Unsigned)
NEUROLOGY CONSULTATION NOTE  Nancy Delgado MRN: [id number] DOB: Jan 19, 2001  Referring provider: Quita Skye, PA-C Primary care provider: Quita Skye, PA-C  Reason for consult: headaches  Assessment/Plan:   Chronic tension type headache, not intractable  Since these are new headaches progressively getting worse, will check MRI of brain with and without contrast Just started sertraline for anxiety, which may help with headaches.  If no improvement in headaches in 6 weeks, she will contact me and we can increase dose.   Excedrin as needed. Limit use of pain relievers to no more than 2 days out of week to prevent risk of rebound or medication-overuse headache. Keep headache diary Recommend getting eye exam Follow up 6 months.   Subjective:  Nancy Delgado is a 22 year old right-handed female with PCOS who presents for headaches.  History supplemented by referring provider's note.  Onset:  5-6 months ago, progressively increased frequency. Location:  usually in a cap distribution on top of head Quality:  squeezing Intensity:  5-6/10 maximum. Aura:  absent Prodrome:  absent Associated symptoms:  None.  She denies associated nausea, vomiting, photophobia, phonophobia, visual disturbance, unilateral numbness or weakness. Duration:  several hours, sometimes up to one day Frequency:  initially once to twice a month, now every 1 to 2 days Frequency of abortive medication: Excedrin every one to two days Triggers:  stress.  No correlation with menstrual cycle.  Usually occurs later in the day Relieving factors:  nap Activity:  Tolerable.  Has not had an eye exam in last 5-6 months.  Past NSAIDS/analgesics:  ibuprofen Past abortive triptans:  sumatriptan tab Past abortive ergotamine:  none Past muscle relaxants:  none Past anti-emetic:  none Past antihypertensive medications:  none Past antidepressant medications:  fluoxetine Past anticonvulsant medications:  none Past anti-CGRP:   none Other past therapies:  none  Current NSAIDS/analgesics:  Excedrin Migraine Current triptans:  none Current ergotamine:  none Current anti-emetic:  none Current muscle relaxants:  none Current Antihypertensive medications:  none Current Antidepressant medications:  Sertraline 50mg  daily (just started), Wellbutrin XL 150mg  daily Current Anticonvulsant medications:  none Current anti-CGRP:  none Current Vitamins/Herbal/Supplements:  ferrous sulfate Current Antihistamines/Decongestants:  Zyrtec Other therapy:  none Birth control:  Aurovela Fe Other medications:  Seroquel 25mg  daily   Caffeine:  Coke daily.  Rarely coffee Diet:  2 to 3 16oz bottles of water daily.  Eats 2 meals a day Exercise:  walks once a week Depression:  stable; Anxiety:  yes Sleep hygiene:  6 hours of sleep a night.  No excessive daytime sleepiness.  Takes a while to fall asleep.  Wakes up during the night once. Family history of headache:  cousin (migraines), mother (migraines)      PAST MEDICAL HISTORY: Past Medical History:  Diagnosis Date   Eczema    Elevated testosterone level in female    Hair abnormality    Hirsuitism   Oligomenorrhea    PCOS (polycystic ovarian syndrome)     PAST SURGICAL HISTORY: Past Surgical History:  Procedure Laterality Date   WISDOM TOOTH EXTRACTION      MEDICATIONS: Current Outpatient Medications on File Prior to Visit  Medication Sig Dispense Refill   brexpiprazole (REXULTI) 1 MG TABS tablet Take 1 mg by mouth daily.     FLUoxetine (PROZAC) 20 MG capsule Take by mouth.     FLUoxetine (PROZAC) 40 MG capsule Take 40 mg by mouth every morning.     norethindrone-ethinyl estradiol-iron (AUROVELA FE 1.5/30) 1.5-30 MG-MCG tablet  Take 1 tablet by mouth daily. 28 tablet 11   traZODone (DESYREL) 50 MG tablet Take 50 mg by mouth at bedtime.     triamcinolone ointment (KENALOG) 0.1 % SMARTSIG:Sparingly Topical Twice Daily     No current facility-administered  medications on file prior to visit.    ALLERGIES: No Known Allergies  FAMILY HISTORY: Family History  Problem Relation Age of Onset   Diabetes Mother        started metformin 08/2017   Hypertension Maternal Grandmother     Objective:  Blood pressure 132/85, pulse 94, height 5\' 6"  (1.676 m), weight (!) 300 lb 12.8 oz (136.4 kg), SpO2 97%. General: No acute distress.  Patient appears well-groomed.   Head:  Normocephalic/atraumatic Eyes:  fundi examined but not visualized Neck: supple, no paraspinal tenderness, full range of motion Back: No paraspinal tenderness Heart: regular rate and rhythm Neurological Exam: Mental status: alert and oriented to person, place, and time, speech fluent and not dysarthric, language intact. Cranial nerves: CN I: not tested CN II: pupils equal, round and reactive to light, visual fields intact CN III, IV, VI:  full range of motion, no nystagmus, no ptosis CN V: facial sensation intact. CN VII: upper and lower face symmetric CN VIII: hearing intact CN IX, X: gag intact, uvula midline CN XI: sternocleidomastoid and trapezius muscles intact CN XII: tongue midline Bulk & Tone: normal, no fasciculations. Motor:  muscle strength 5/5 throughout Sensation:  Pinprick, temperature and vibratory sensation intact. Deep Tendon Reflexes:  2+ throughout,  toes downgoing.   Finger to nose testing:  Without dysmetria.   Gait:  Normal station and stride.  Romberg negative.    Thank you for allowing me to take part in the care of this patient.  Shon Millet, DO  CC: Quita Skye, PA-C

## 2023-10-09 ENCOUNTER — Encounter: Payer: Self-pay | Admitting: Neurology

## 2023-10-09 ENCOUNTER — Ambulatory Visit: Payer: Medicaid Other | Admitting: Neurology

## 2023-10-09 VITALS — BP 132/85 | HR 94 | Ht 66.0 in | Wt 300.8 lb

## 2023-10-09 DIAGNOSIS — G44229 Chronic tension-type headache, not intractable: Secondary | ICD-10-CM | POA: Diagnosis not present

## 2023-10-09 NOTE — Patient Instructions (Addendum)
  Check MRI of brain with and without contrast Continue sertraline 50mg  daily.  If no improvement in 6 weeks, contact me and we can increase dose Limit use of pain relievers (Excedrin) to no more than 2 days out of week to prevent risk of rebound or medication-overuse headache. Stop drinking Cokes and increase water intake Follow sleep hygiene tips as discussed Keep headache diary Get eye exam Follow up 5 months.

## 2023-10-13 ENCOUNTER — Encounter: Payer: Self-pay | Admitting: Neurology

## 2023-10-20 ENCOUNTER — Other Ambulatory Visit (HOSPITAL_BASED_OUTPATIENT_CLINIC_OR_DEPARTMENT_OTHER): Payer: Self-pay

## 2023-10-20 MED ORDER — SEMAGLUTIDE-WEIGHT MANAGEMENT 0.25 MG/0.5ML ~~LOC~~ SOAJ
0.2500 mg | SUBCUTANEOUS | 0 refills | Status: AC
  Filled 2023-10-20: qty 2, 28d supply, fill #0

## 2023-10-21 ENCOUNTER — Other Ambulatory Visit (HOSPITAL_BASED_OUTPATIENT_CLINIC_OR_DEPARTMENT_OTHER): Payer: Self-pay

## 2023-10-21 ENCOUNTER — Other Ambulatory Visit: Payer: Self-pay

## 2023-10-28 ENCOUNTER — Ambulatory Visit
Admission: RE | Admit: 2023-10-28 | Discharge: 2023-10-28 | Disposition: A | Discharge status: Home / Self Care | Payer: Medicaid Other | Source: Ambulatory Visit | Attending: Neurology | Admitting: Neurology

## 2023-10-28 DIAGNOSIS — G44229 Chronic tension-type headache, not intractable: Secondary | ICD-10-CM

## 2023-10-28 MED ORDER — GADOPICLENOL 0.5 MMOL/ML IV SOLN
10.0000 mL | Freq: Once | INTRAVENOUS | Status: AC | PRN
  Administered 2023-10-28: 10 mL via INTRAVENOUS

## 2023-10-31 ENCOUNTER — Other Ambulatory Visit (HOSPITAL_BASED_OUTPATIENT_CLINIC_OR_DEPARTMENT_OTHER): Payer: Self-pay

## 2023-11-14 ENCOUNTER — Other Ambulatory Visit (HOSPITAL_BASED_OUTPATIENT_CLINIC_OR_DEPARTMENT_OTHER): Payer: Self-pay

## 2023-11-14 MED ORDER — WEGOVY 0.5 MG/0.5ML ~~LOC~~ SOAJ
0.5000 mg | SUBCUTANEOUS | 1 refills | Status: AC
  Filled 2023-11-14: qty 2, 28d supply, fill #0

## 2023-11-15 ENCOUNTER — Other Ambulatory Visit (HOSPITAL_BASED_OUTPATIENT_CLINIC_OR_DEPARTMENT_OTHER): Payer: Self-pay

## 2023-12-24 ENCOUNTER — Encounter (INDEPENDENT_AMBULATORY_CARE_PROVIDER_SITE_OTHER): Payer: Self-pay

## 2024-03-08 NOTE — Progress Notes (Deleted)
 NEUROLOGY FOLLOW UP OFFICE NOTE  Nancy Delgado [id number]  Assessment/Plan:   Chronic tension type headache, not intractable  Headache prevention:  *** Headache rescue:  Excedrin as needed. Limit use of pain relievers to no more than 2 days out of week to prevent risk of rebound or medication-overuse headache. *** Keep headache diary Follow up 6 months. ***   Subjective:  Nancy Delgado is a 23 year old right-handed female with PCOS who follows up for headaches.  UPDATE: MRI of brain with and without contrast on 10/28/2023 personally reviewed was normal.  Had started on sertraline by her PCP for anxiety. *** Intensity:  *** Duration:  *** Frequency:  ***  Eye exam ***  Frequency of abortive medication: *** Current NSAIDS/analgesics:  Excedrin Migraine Current triptans:  none Current ergotamine:  none Current anti-emetic:  none Current muscle relaxants:  none Current Antihypertensive medications:  none Current Antidepressant medications:  Sertraline 50mg  daily, Wellbutrin XL 150mg  daily Current Anticonvulsant medications:  none Current anti-CGRP:  none Current Vitamins/Herbal/Supplements:  ferrous sulfate Current Antihistamines/Decongestants:  Zyrtec Other therapy:  none Birth control:  Aurovela Fe Other medications:  Seroquel 25mg  daily   Caffeine:  Coke daily.  Rarely coffee Diet:  2 to 3 16oz bottles of water daily.  Eats 2 meals a day Exercise:  walks once a week Depression:  stable; Anxiety:  yes Sleep hygiene:  6 hours of sleep a night.  No excessive daytime sleepiness.  Takes a while to fall asleep.  Wakes up during the night once.  HISTORY: Onset:  5-6 months ago, progressively increased frequency. Location:  usually in a cap distribution on top of head Quality:  squeezing Intensity:  5-6/10 maximum. Aura:  absent Prodrome:  absent Associated symptoms:  None.  She denies associated nausea, vomiting, photophobia, phonophobia, visual disturbance,  unilateral numbness or weakness. Duration:  several hours, sometimes up to one day Frequency:  initially once to twice a month, now every 1 to 2 days Frequency of abortive medication: Excedrin every one to two days Triggers:  stress.  No correlation with menstrual cycle.  Usually occurs later in the day Relieving factors:  nap Activity:  Tolerable.  Has not had an eye exam in last 5-6 months.  Past NSAIDS/analgesics:  ibuprofen Past abortive triptans:  sumatriptan tab Past abortive ergotamine:  none Past muscle relaxants:  none Past anti-emetic:  none Past antihypertensive medications:  none Past antidepressant medications:  fluoxetine Past anticonvulsant medications:  none Past anti-CGRP:  none Other past therapies:  none   Family history of headache:  cousin (migraines), mother (migraines)  PAST MEDICAL HISTORY: Past Medical History:  Diagnosis Date   Anxiety and depression    Eczema    Elevated testosterone level in female    Hair abnormality    Hirsuitism   Oligomenorrhea    PCOS (polycystic ovarian syndrome)     MEDICATIONS: Current Outpatient Medications on File Prior to Visit  Medication Sig Dispense Refill   buPROPion (WELLBUTRIN XL) 150 MG 24 hr tablet Take 150 mg by mouth every morning.     ferrous sulfate 324 (65 Fe) MG TBEC Take by mouth.     norethindrone-ethinyl estradiol-iron (AUROVELA FE 1.5/30) 1.5-30 MG-MCG tablet Take 1 tablet by mouth daily. 28 tablet 11   QUEtiapine (SEROQUEL) 25 MG tablet Take 25 mg by mouth daily at 2 PM.     Semaglutide-Weight Management (WEGOVY) 0.5 MG/0.5ML SOAJ Inject 0.5 mg into the skin once a week 2 mL 1   Semaglutide-Weight  Management 0.25 MG/0.5ML SOAJ Inject 0.25 mg into the skin once a week. 2 mL 0   sertraline (ZOLOFT) 50 MG tablet Take 50 mg by mouth every morning.     triamcinolone ointment (KENALOG) 0.1 % SMARTSIG:Sparingly Topical Twice Daily     No current facility-administered medications on file prior to visit.     ALLERGIES: No Known Allergies  FAMILY HISTORY: Family History  Problem Relation Age of Onset   Diabetes Mother        started metformin 08/2017   Stroke Father    Hypertension Maternal Grandmother    Migraines Cousin       Objective:  *** General: No acute distress.  Patient appears well-groomed.  ***   Shon Millet, DO

## 2024-03-09 ENCOUNTER — Ambulatory Visit: Payer: Medicaid Other | Admitting: Neurology
